# Patient Record
Sex: Female | Born: 1975
Health system: Southern US, Community
[De-identification: ages and names within clinical notes are randomized; demographics above are authoritative.]

## PROBLEM LIST (undated history)

## (undated) DIAGNOSIS — R51 Headache: Secondary | ICD-10-CM

## (undated) DIAGNOSIS — I10 Essential (primary) hypertension: Secondary | ICD-10-CM

## (undated) DIAGNOSIS — R112 Nausea with vomiting, unspecified: Secondary | ICD-10-CM

## (undated) DIAGNOSIS — C801 Malignant (primary) neoplasm, unspecified: Secondary | ICD-10-CM

## (undated) DIAGNOSIS — Z9889 Other specified postprocedural states: Secondary | ICD-10-CM

## (undated) DIAGNOSIS — R519 Headache, unspecified: Secondary | ICD-10-CM

## (undated) HISTORY — PX: LAPAROSCOPY: SHX197

---

## 1997-12-22 ENCOUNTER — Other Ambulatory Visit: Admission: RE | Admit: 1997-12-22 | Discharge: 1997-12-22 | Payer: Self-pay | Admitting: Obstetrics and Gynecology

## 1999-02-25 ENCOUNTER — Other Ambulatory Visit: Admission: RE | Admit: 1999-02-25 | Discharge: 1999-02-25 | Payer: Self-pay | Admitting: *Deleted

## 2000-03-06 ENCOUNTER — Other Ambulatory Visit: Admission: RE | Admit: 2000-03-06 | Discharge: 2000-03-06 | Payer: Self-pay | Admitting: Obstetrics and Gynecology

## 2000-09-04 ENCOUNTER — Ambulatory Visit (HOSPITAL_COMMUNITY): Admission: RE | Admit: 2000-09-04 | Discharge: 2000-09-04 | Payer: Self-pay | Admitting: Obstetrics and Gynecology

## 2000-09-04 ENCOUNTER — Encounter (INDEPENDENT_AMBULATORY_CARE_PROVIDER_SITE_OTHER): Payer: Self-pay | Admitting: Specialist

## 2001-05-23 ENCOUNTER — Other Ambulatory Visit: Admission: RE | Admit: 2001-05-23 | Discharge: 2001-05-23 | Payer: Self-pay | Admitting: Obstetrics and Gynecology

## 2001-09-30 ENCOUNTER — Emergency Department (HOSPITAL_COMMUNITY): Admission: EM | Admit: 2001-09-30 | Discharge: 2001-09-30 | Payer: Self-pay | Admitting: Emergency Medicine

## 2001-09-30 ENCOUNTER — Encounter: Payer: Self-pay | Admitting: Emergency Medicine

## 2002-09-23 ENCOUNTER — Other Ambulatory Visit: Admission: RE | Admit: 2002-09-23 | Discharge: 2002-09-23 | Payer: Self-pay | Admitting: Obstetrics and Gynecology

## 2006-10-19 ENCOUNTER — Inpatient Hospital Stay (HOSPITAL_COMMUNITY): Admission: RE | Admit: 2006-10-19 | Discharge: 2006-10-21 | Payer: Self-pay | Admitting: Obstetrics and Gynecology

## 2010-06-03 ENCOUNTER — Emergency Department (HOSPITAL_COMMUNITY): Payer: 59

## 2010-06-03 ENCOUNTER — Emergency Department (HOSPITAL_COMMUNITY)
Admission: EM | Admit: 2010-06-03 | Discharge: 2010-06-03 | Disposition: A | Discharge status: Home / Self Care | Payer: 59 | Attending: Emergency Medicine | Admitting: Emergency Medicine

## 2010-06-03 DIAGNOSIS — R079 Chest pain, unspecified: Secondary | ICD-10-CM | POA: Insufficient documentation

## 2010-06-03 DIAGNOSIS — R0602 Shortness of breath: Secondary | ICD-10-CM | POA: Insufficient documentation

## 2010-06-03 DIAGNOSIS — R05 Cough: Secondary | ICD-10-CM | POA: Insufficient documentation

## 2010-06-03 DIAGNOSIS — R791 Abnormal coagulation profile: Secondary | ICD-10-CM | POA: Insufficient documentation

## 2010-06-03 DIAGNOSIS — R059 Cough, unspecified: Secondary | ICD-10-CM | POA: Insufficient documentation

## 2010-06-03 DIAGNOSIS — I10 Essential (primary) hypertension: Secondary | ICD-10-CM | POA: Insufficient documentation

## 2010-06-03 DIAGNOSIS — J189 Pneumonia, unspecified organism: Secondary | ICD-10-CM | POA: Insufficient documentation

## 2010-06-03 LAB — BASIC METABOLIC PANEL
BUN: 13 mg/dL (ref 6–23)
CO2: 27 mEq/L (ref 19–32)
Calcium: 9 mg/dL (ref 8.4–10.5)
Creatinine, Ser: 0.5 mg/dL (ref 0.4–1.2)
GFR calc non Af Amer: >60 mL/min (ref >60)
Glucose, Bld: 97 mg/dL (ref 70–99)
Sodium: 136 mEq/L (ref 135–145)

## 2010-06-03 LAB — DIFFERENTIAL
Basophils Absolute: 0 10*3/uL (ref 0.0–0.1)
Basophils Relative: 0 % (ref 0–1)
Eosinophils Absolute: 0.1 10*3/uL (ref 0.0–0.7)
Eosinophils Relative: 1 % (ref 0–5)
Lymphocytes Relative: 22 % (ref 12–46)
Lymphs Abs: 1.8 10*3/uL (ref 0.7–4.0)
Monocytes Absolute: 1 10*3/uL (ref 0.1–1.0)
Monocytes Relative: 12 % (ref 3–12)
Neutro Abs: 5.4 10*3/uL (ref 1.7–7.7)
Neutrophils Relative %: 64 % (ref 43–77)

## 2010-06-03 LAB — URINALYSIS, ROUTINE W REFLEX MICROSCOPIC
Bilirubin Urine: NEGATIVE
Hgb urine dipstick: NEGATIVE
Ketones, ur: NEGATIVE mg/dL
Nitrite: NEGATIVE
Protein, ur: NEGATIVE mg/dL
Specific Gravity, Urine: 1.015 (ref 1.005–1.030)
Urobilinogen, UA: 0.2 mg/dL (ref 0.0–1.0)

## 2010-06-03 LAB — CBC
HCT: 40.4 % (ref 36.0–46.0)
Hemoglobin: 13.7 g/dL (ref 12.0–15.0)
MCH: 29.7 pg (ref 26.0–34.0)
MCHC: 33.9 g/dL (ref 30.0–36.0)
MCV: 87.4 fL (ref 78.0–100.0)
RBC: 4.62 MIL/uL (ref 3.87–5.11)
WBC: 8.4 10*3/uL (ref 4.0–10.5)

## 2010-06-03 LAB — POCT CARDIAC MARKERS
CKMB, poc: <1 ng/mL — ABNORMAL LOW (ref 1.0–8.0)
Myoglobin, poc: 45.2 ng/mL (ref 12–200)
Troponin i, poc: <0.05 ng/mL (ref 0.00–0.09)

## 2010-06-03 MED ORDER — IOHEXOL 300 MG/ML  SOLN
100.0000 mL | Freq: Once | INTRAMUSCULAR | Status: AC | PRN
  Administered 2010-06-03: 100 mL via INTRAVENOUS

## 2010-08-10 NOTE — H&P (Signed)
Brittany [name], Brittany [name]            ACCOUNT NO.:  [id number]   MEDICAL RECORD NO.:  [id number]          PATIENT TYPE:  INP   LOCATION:                                FACILITY:  [facility]   PHYSICIAN:  Lenoard Aden, M.D.DATE OF BIRTH:  1976-03-22   DATE OF ADMISSION:  10/19/2006  DATE OF DISCHARGE:                              HISTORY & PHYSICAL   CHIEF COMPLAINT:  Presumed macrosomia.   She is a 35 year old white female, G3, P2, at 39-3/7 weeks' gestation  for induction, estimated fetal weight greater that 8-1/2 pounds.   ALLERGIES:  1. She has allergies to ERYTHROMYCIN.  2. MACROBID.   MEDICATIONS:  Prenatal vitamins.   She is a nonsmoker, nondrinker.   She denies domestic or physical violence.   FAMILY HISTORY:  Family history of kidney stones, stroke, lupus,  diabetes, heart disease, hypertension.   PERSONAL HISTORY:  1. Laparoscopy.  2. Endometriosis.  3. UTI.   PREGNANCY COURSE:  Complicated by __________ and probable estimated  fetal weight possibly 9 pounds.   PHYSICAL EXAMINATION:  VITAL SIGNS:  Stable.  She is afebrile.  HEENT:  Normal.  LUNGS:  Clear.  HEART:  Regular rate and rhythm.  ABDOMEN:  Soft, gravid, nontender.  PELVIC:  Cervix 2 cm, thick, vertex, -1.  EXTREMITIES:  Normal.  NEUROLOGIC:  Nonfocal.   IMPRESSION:  A 39-week intrauterine pregnancy, probable large for  gestational age and favorable cervix for induction.   PLAN:  Proceed with induction.  Risks, benefits discussed.      Lenoard Aden, M.D.  Electronically Signed     RJT/MEDQ  D:  10/18/2006  T:  10/19/2006  Job:  [id number]

## 2010-08-13 NOTE — H&P (Signed)
[facility]  Patient:    Brittany [name], Brittany [name]                  MRN: [id number] Adm. Date:  08/30/00 Attending:  Erie Noe P. Pennie Rushing, M.D. Dictator:   Henreitta Leber, P.A.                         History and Physical  DATE OF BIRTH:                [birth year]  HISTORY OF PRESENT ILLNESS:   The patient is a 35 year old, married white female, para 1-0-0-1, with a several year history of pelvic pain and dysmenorrhea, which have worsened over the past year.  For the past year, the patient had experienced almost daily, dull, achy pelvic pain (which she rates a 5/10 on a 10 point scale), which has not responded to Naprosyn nor birth control pills.  One-week prior to and during her menstrual period, the patients pain increases to a 10/10 intensity (on a 10 point scale), with occasional fleeting, sharp pain.  The pain is made worse by prolonged sitting, and only minimally relieved by a heating pad.  The patient had a negative gonorrhea and Chlamydia cultures along with a normal pelvic ultrasound in September of 2001.  Over the past six months the patient has developed deep dyspareunia and presents now for further evaluation of her pelvic pain in the form of a diagnostic laparoscopy.  OBSTETRICAL HISTORY:          Gravida 1, para 1-0-0-1; patients blood type is A negative.  GYNECOLOGIC HISTORY:          Menarche 35 years old.  She has a history of irregular periods, however, they are regulated by birth control pills; she admits to severe dysmenorrhea.  See history of present illness; denies any history of ovarian cyst, sexual transmitted diseases or abnormal Pap smear; last Pap smear was normal, December 2001.  PAST SURGICAL HISTORY:        Positive for laser eye surgery in 1999.  Also removal of precancerous skin lesion.  PAST MEDICAL HISTORY:         Positive for dysplastic skin lesion, seasonal allergies, and a history of elevated blood  pressure.  FAMILY HISTORY:               Positive for heart disease, hypertension, insulin-dependent diabetes and lupus.  SOCIAL HISTORY:               The patient is married and she is a Production designer, theatre/television/film with [employer].  HABITS:                       The patient does not smoke and only uses alcohol on rare occasions.  MEDICATIONS:                  Alesse.  ALLERGIES:                    Drug sensitivities, ERYTHEMA and MACROBID, both of which cause severe GI upset.  REVIEW OF SYSTEMS:            Positive for frequent headaches, nocturia, urinary frequency and pelvic pain (see history of present illness), otherwise negative.  PHYSICAL EXAMINATION:  VITAL SIGNS:                  Blood pressure 110/70, weight is  127-1/2, height is 5 feet 3 inches tall.  HEENT:                        Ear, nose and throat examination is within normal limits.  NECK:                         Supple without masses.  There is no thyromegaly.  HEART:                        Regular rate and rhythm.  There is no murmur.  LUNGS:                        No wheezes, rubs, or rhonchi.  ABDOMEN:                      Bowel sounds are present.  It is soft.  The patient has tenderness without guarding in both lower quadrants.  There is no rebound or organomegaly.  BACK:                         Without CVA tenderness.  EXTREMITIES:                  Without clubbing, cyanosis, or edema.  PELVIC:                       EG/BUS is within normal limits.  Vagina is rugous.  Cervix is nontender without lesions.  Uterus, normal size, shape, consistency without tenderness.  Adnexa tender bilaterally, left greater than right without any palpable masses.  Rectovaginal without masses or tenderness.  IMPRESSION:                   1. Chronic pelvic pain.                               2. Severe dysmenorrhea.  DISPOSITION:                  A discussion was held with the patient regarding the medical and surgical options for  management of her pelvic pain and the patient has consented to undergo diagnostic laparoscopy with the possibility of ablation.  The patient understands the implications for her procedure and is willing to accept the risks to include, but are not limited to reaction to anesthesia, infection, bleeding, and damage to adjacent organs.  She is scheduled to undergo a diagnostic laparoscopy at [facility] on September 04, 2000, at 10:15 a.m. DD:  08/30/00 TD:  08/30/00 Job: [id number]

## 2010-08-13 NOTE — Op Note (Signed)
[facility]  Patient:    Brittany [name], Brittany [name]                   MRN: [id number] Proc. Date: 09/04/00 Adm. Date:  [id number] Attending:  Dierdre Forth Pearline                           Operative Report  PREOPERATIVE DIAGNOSIS:       Pelvic pain.  POSTOPERATIVE DIAGNOSIS:      Pelvic pain, possible endometriosis.  OPERATION:                    1. Diagnostic laparoscopy.                               2. Ablation of endometriosis.                               3. Peritoneal biopsies.  SURGEON:                      Vanessa P. Pennie Rushing, M.D.  ANESTHESIA:                   General orotracheal.  ESTIMATED BLOOD LOSS:         Less than 50 cc.  COMPLICATIONS:                None.  FINDINGS:                     The uterus, tubes, and ovaries were within normal limits without adhesions or evidence of endometriosis.  The uterus was posterior, and, in the posterior cul-de-sacs on each of the uterosacral ligaments, there were powder burn lesions with peritoneal puckering consistent with endometriosis.  There were also hypopigmented lesions along the uterosacral ligament on the right side.  The ovarian fossae were free of any evidence of endometriosis.  The appendix appeared normal.  DESCRIPTION OF PROCEDURE:     The patient was taken to the operating room after appropriate identification and placed on the operating table.  After the attainment of adequate general anesthesia, she was placed in the modified lithotomy position.  The abdomen, perineum, and vagina were prepped with multiple layers of Betadine.  A Foley catheter was inserted into the bladder and connected to straight drainage.  A Hulka tenaculum was placed on the cervix.  The abdomen was draped as a sterile field.  Subumbilical and suprapubic infiltration with 0.25 Marcaine for a total of 10 cc was undertaken.  A subumbilical incision was made and a Veress cannula placed through that incision  into the peritoneal cavity.  A pneumoperitoneum was created with 3.5 liters of CO2.  The Veress cannula was removed and a laparoscopic trocar placed through that incision into the perineal cavity. The laparoscope was placed through the trocar sleeve.  Suprapubic incisions was made to the right and left of midline, and laparoscopic probe trocars placed through those incisions into the peritoneal cavity under direct visualization.  The above-noted findings were made and documented.  The biopsy forceps was used to obtain copious biopsies from each uterosacral ligament. The remainder of the lesions were then ablated with harmonic scalpel energy. Copious irrigation was carried out, and approximately 100 cc of warm lactated Ringers left in the peritoneal cavity.  All instruments were removed, and the subumbilical and suprapubic incisions were closed with subcuticular sutures of 3-0 Vicryl.  Sterile dressing were applied.  The patient was awakened from general anesthesia and taken to the recovery room in satisfactory condition having tolerated the procedure well with sponge and instrument counts correct. DD:  09/04/00 TD:  09/04/00 Job: [id number]

## 2011-01-10 LAB — CBC
HCT: 33 — ABNORMAL LOW
Hemoglobin: 12.5
MCHC: 34.6
Platelets: 156
Platelets: 183
RDW: 13.6
RDW: 13.6
WBC: 12 — ABNORMAL HIGH

## 2011-01-10 LAB — RAPID HIV SCREEN (WH-MAU): Rapid HIV Screen: NONREACTIVE

## 2011-08-11 ENCOUNTER — Encounter (HOSPITAL_COMMUNITY): Payer: Self-pay

## 2011-08-11 ENCOUNTER — Emergency Department (HOSPITAL_COMMUNITY)
Admission: EM | Admit: 2011-08-11 | Discharge: 2011-08-11 | Disposition: A | Discharge status: Home / Self Care | Payer: 59 | Attending: Emergency Medicine | Admitting: Emergency Medicine

## 2011-08-11 DIAGNOSIS — M545 Low back pain, unspecified: Secondary | ICD-10-CM | POA: Insufficient documentation

## 2011-08-11 DIAGNOSIS — M549 Dorsalgia, unspecified: Secondary | ICD-10-CM

## 2011-08-11 DIAGNOSIS — R3915 Urgency of urination: Secondary | ICD-10-CM | POA: Insufficient documentation

## 2011-08-11 DIAGNOSIS — R509 Fever, unspecified: Secondary | ICD-10-CM | POA: Insufficient documentation

## 2011-08-11 DIAGNOSIS — R350 Frequency of micturition: Secondary | ICD-10-CM | POA: Insufficient documentation

## 2011-08-11 DIAGNOSIS — R112 Nausea with vomiting, unspecified: Secondary | ICD-10-CM | POA: Insufficient documentation

## 2011-08-11 DIAGNOSIS — R109 Unspecified abdominal pain: Secondary | ICD-10-CM | POA: Insufficient documentation

## 2011-08-11 DIAGNOSIS — R300 Dysuria: Secondary | ICD-10-CM | POA: Insufficient documentation

## 2011-08-11 DIAGNOSIS — I10 Essential (primary) hypertension: Secondary | ICD-10-CM | POA: Insufficient documentation

## 2011-08-11 HISTORY — DX: Essential (primary) hypertension: I10

## 2011-08-11 LAB — URINALYSIS, ROUTINE W REFLEX MICROSCOPIC
Bilirubin Urine: NEGATIVE
Hgb urine dipstick: NEGATIVE
Nitrite: NEGATIVE
Protein, ur: NEGATIVE mg/dL
Specific Gravity, Urine: 1.013 (ref 1.005–1.030)
Urobilinogen, UA: 0.2 mg/dL (ref 0.0–1.0)

## 2011-08-11 LAB — PREGNANCY, URINE: Preg Test, Ur: NEGATIVE

## 2011-08-11 MED ORDER — ONDANSETRON HCL 4 MG/2ML IJ SOLN: 4.0000 mg | Freq: Once | INTRAMUSCULAR | Status: DC

## 2011-08-11 MED ORDER — HYDROCODONE-ACETAMINOPHEN 5-500 MG PO TABS: 1.0000 | ORAL_TABLET | Freq: Four times a day (QID) | ORAL | Status: AC | PRN

## 2011-08-11 MED ORDER — SODIUM CHLORIDE 0.9 % IV BOLUS (SEPSIS): 1000.0000 mL | Freq: Once | INTRAVENOUS | Status: DC

## 2011-08-11 NOTE — ED Notes (Signed)
Pt. With bilateral back pain, no pain with urination but a history of frequent UTI's that present with lower back pain.  Pt. Reports nausea but no vomiting.  No dysuria.

## 2011-08-11 NOTE — ED Notes (Signed)
Patient reports that she has had low back pain with dysuria x 3 days. Patient denies hematuria and chills

## 2011-08-11 NOTE — ED Notes (Signed)
Assisted with Pelvic Exam, Dr. Rosalia Hammers told me to" disregard the specimen". RN  Aram Beecham aware

## 2011-08-11 NOTE — ED Notes (Signed)
Pt. Refused  Medications and fluid.  PA aware.

## 2011-08-11 NOTE — Discharge Instructions (Signed)
Back Pain, Adult Low back pain is very common. About 1 in 5 people have back pain.The cause of low back pain is rarely dangerous. The pain often gets better over time.About half of people with a sudden onset of back pain feel better in just 2 weeks. About 8 in 10 people feel better by 6 weeks.  CAUSES Some common causes of back pain include:  Strain of the muscles or ligaments supporting the spine.   Wear and tear (degeneration) of the spinal discs.   Arthritis.   Direct injury to the back.  DIAGNOSIS Most of the time, the direct cause of low back pain is not known.However, back pain can be treated effectively even when the exact cause of the pain is unknown.Answering your caregiver's questions about your overall health and symptoms is one of the most accurate ways to make sure the cause of your pain is not dangerous. If your caregiver needs more information, he or she may order lab work or imaging tests (X-rays or MRIs).However, even if imaging tests show changes in your back, this usually does not require surgery. HOME CARE INSTRUCTIONS For many people, back pain returns.Since low back pain is rarely dangerous, it is often a condition that people can learn to manageon their own.   Remain active. It is stressful on the back to sit or stand in one place. Do not sit, drive, or stand in one place for more than 30 minutes at a time. Take short walks on level surfaces as soon as pain allows.Try to increase the length of time you walk each day.   Do not stay in bed.Resting more than 1 or 2 days can delay your recovery.   Do not avoid exercise or work.Your body is made to move.It is not dangerous to be active, even though your back may hurt.Your back will likely heal faster if you return to being active before your pain is gone.   Pay attention to your body when you bend and lift. Many people have less discomfortwhen lifting if they bend their knees, keep the load close to their  bodies,and avoid twisting. Often, the most comfortable positions are those that put less stress on your recovering back.   Find a comfortable position to sleep. Use a firm mattress and lie on your side with your knees slightly bent. If you lie on your back, put a pillow under your knees.   Only take over-the-counter or prescription medicines as directed by your caregiver. Over-the-counter medicines to reduce pain and inflammation are often the most helpful.Your caregiver may prescribe muscle relaxant drugs.These medicines help dull your pain so you can more quickly return to your normal activities and healthy exercise.   Put ice on the injured area.   Put ice in a plastic bag.   Place a towel between your skin and the bag.   Leave the ice on for 15 to 20 minutes, 3 to 4 times a day for the first 2 to 3 days. After that, ice and heat may be alternated to reduce pain and spasms.   Ask your caregiver about trying back exercises and gentle massage. This may be of some benefit.   Avoid feeling anxious or stressed.Stress increases muscle tension and can worsen back pain.It is important to recognize when you are anxious or stressed and learn ways to manage it.Exercise is a great option.  SEEK MEDICAL CARE IF:  You have pain that is not relieved with rest or medicine.   You have   pain that does not improve in 1 week.   You have new symptoms.   You are generally not feeling well.  SEEK IMMEDIATE MEDICAL CARE IF:   You have pain that radiates from your back into your legs.   You develop new bowel or bladder control problems.   You have unusual weakness or numbness in your arms or legs.   You develop nausea or vomiting.   You develop abdominal pain.   You feel faint.  Document Released: 03/14/2005 Document Revised: 03/03/2011 Document Reviewed: 08/02/2010 [employer] Patient Information 2012 [employer]. 

## 2011-08-11 NOTE — ED Provider Notes (Signed)
History     CSN: [id number]  Arrival date & time 08/11/11  1450   None     Chief Complaint  Patient presents with  . Back Pain  . Dysuria    (Consider location/radiation/quality/duration/timing/severity/associated sxs/prior treatment) Patient is a 36 y.o. female presenting with back pain and dysuria. The history is provided by the patient.  Back Pain  The current episode started 3 to 5 hours ago. The problem occurs constantly. The pain is associated with no known injury. The pain is present in the lumbar spine. The quality of the pain is described as aching. The pain is at a severity of 5/10. Associated symptoms include dysuria. Pertinent negatives include no bowel incontinence, no perianal numbness, no bladder incontinence, no paresthesias, no paresis, no tingling and no weakness. She has tried NSAIDs for the symptoms. The treatment provided no relief.  Dysuria  The current episode started 3 to 5 hours ago. The problem occurs every urination. The problem has not changed since onset.The quality of the pain is described as burning. The pain is at a severity of 4/10. The maximum temperature recorded prior to her arrival was 100 to 100.9 F. The fever has been present for 3 to 4 days. She is sexually active. There is no history of pyelonephritis. Associated symptoms include chills, nausea, vomiting, frequency, urgency and flank pain. Pertinent negatives include no sweats, no discharge, no hematuria, no hesitancy and no possible pregnancy. She has tried NSAIDs for the symptoms. Her past medical history is significant for recurrent UTIs. Her past medical history does not include single kidney.    Past Medical History  Diagnosis Date  . Hypertension     Past Surgical History  Procedure Date  . Laparoscopy     History reviewed. No pertinent family history.  History  Substance Use Topics  . Smoking status: Never Smoker   . Smokeless tobacco: Never Used  . Alcohol Use: 0.6 oz/week    1  Glasses of wine per week     occasionally    OB History    Grav Para Term Preterm Abortions TAB SAB Ect Mult Living                  Review of Systems  Constitutional: Positive for chills.  Gastrointestinal: Positive for nausea and vomiting. Negative for bowel incontinence.  Genitourinary: Positive for dysuria, urgency, frequency and flank pain. Negative for bladder incontinence, hesitancy and hematuria.  Musculoskeletal: Positive for back pain.  Neurological: Negative for tingling, weakness and paresthesias.  All other systems reviewed and are negative.    Allergies  Erythromycin and Macrobid  Home Medications   Current Outpatient Rx  Name Route Sig Dispense Refill  . IBUPROFEN 200 MG PO TABS Oral Take 800 mg by mouth every 6 (six) hours as needed. pain    . LISINOPRIL 10 MG PO TABS Oral Take 10 mg by mouth daily.    Marland Kitchen GENERESS FE PO Oral Take 1 tablet by mouth daily.      BP 137/77  Pulse 85  Temp(Src) 98.2 F (36.8 C) (Oral)  Resp 16  SpO2 100%  LMP 07/28/2011  Physical Exam  Nursing note and vitals reviewed. Constitutional: She appears well-developed and well-nourished.  HENT:  Head: Normocephalic and atraumatic.  Eyes: Conjunctivae and EOM are normal. Pupils are equal, round, and reactive to light.  Neck: Normal range of motion. Neck supple.  Cardiovascular: Normal rate, regular rhythm, normal heart sounds and intact distal pulses.   Pulmonary/Chest: Effort  normal and breath sounds normal.  Abdominal: Soft. Bowel sounds are normal.  Genitourinary: Vagina normal and uterus normal. No vaginal discharge found.  Musculoskeletal: Normal range of motion.  Neurological: She is alert.  Skin: Skin is warm and dry.  Psychiatric: She has a normal mood and affect. Her behavior is normal. Thought content normal.    ED Course  Procedures (including critical care time)   Labs Reviewed  PREGNANCY, URINE  URINALYSIS, ROUTINE W REFLEX MICROSCOPIC   No results  found.   No diagnosis found.    MDM  Patient now states that her abdomen only hurt because she had to urinate at the time of exam. Reexamination reveals abdomen to be soft and nontender. Patient states she has been doing lifting recently and has some pain in her right low back and buttock area. That has not been relieved with nonsteroidals or Tylenol. She requested other pain medicine. Patient advised to orally rehydrate and recheck if she is unable to keep down fluids as she had some ketones in her urine. She does not appear to have urinary tract infection she does appear to have musculoskeletal back pain but is advised to return if it is not resolved.       Hilario Quarry, MD 08/11/11 [phone]

## 2012-12-19 ENCOUNTER — Encounter (HOSPITAL_COMMUNITY): Payer: Self-pay | Admitting: Pharmacist

## 2012-12-24 ENCOUNTER — Encounter (HOSPITAL_COMMUNITY): Payer: Self-pay

## 2012-12-24 ENCOUNTER — Encounter (HOSPITAL_COMMUNITY)
Admission: RE | Admit: 2012-12-24 | Discharge: 2012-12-24 | Disposition: A | Discharge status: Home / Self Care | Payer: 59 | Source: Ambulatory Visit | Attending: Obstetrics and Gynecology | Admitting: Obstetrics and Gynecology

## 2012-12-24 DIAGNOSIS — Z01812 Encounter for preprocedural laboratory examination: Secondary | ICD-10-CM | POA: Insufficient documentation

## 2012-12-24 DIAGNOSIS — Z01818 Encounter for other preprocedural examination: Secondary | ICD-10-CM | POA: Insufficient documentation

## 2012-12-24 HISTORY — DX: Nausea with vomiting, unspecified: R11.2

## 2012-12-24 HISTORY — DX: Nausea with vomiting, unspecified: Z98.890

## 2012-12-24 LAB — BASIC METABOLIC PANEL
Calcium: 9.1 mg/dL (ref 8.4–10.5)
Creatinine, Ser: 0.71 mg/dL (ref 0.50–1.10)
GFR calc Af Amer: >90 mL/min (ref >90)
Sodium: 138 mEq/L (ref 135–145)

## 2012-12-24 LAB — CBC
MCH: 31 pg (ref 26.0–34.0)
MCV: 90.9 fL (ref 78.0–100.0)
Platelets: 241 10*3/uL (ref 150–400)
RDW: 12.7 % (ref 11.5–15.5)
WBC: 6.1 10*3/uL (ref 4.0–10.5)

## 2012-12-24 NOTE — Patient Instructions (Addendum)
Your procedure is scheduled on:01/03/13  Enter through the Main Entrance at :1145am Pick up desk phone and dial [phone] and inform us of your arrival.  Please call [phone] if you have any problems the morning of surgery.  Remember: Do not eat food after midnight:Wed. Clear liquids are ok until:9am Thursday   You may brush your teeth the morning of surgery.  Take these meds the morning of surgery with a sip of water:Labetalol  DO NOT wear jewelry, eye make-up, lipstick,body lotion, or dark fingernail polish.  (Polished toes are ok) You may wear deodorant.   Patients discharged on the day of surgery will not be allowed to drive home. Wear loose fitting, comfortable clothes for your ride home.

## 2012-12-25 ENCOUNTER — Other Ambulatory Visit: Payer: Self-pay | Admitting: Obstetrics and Gynecology

## 2012-12-28 MED ORDER — BUPIVACAINE HCL (PF) 0.5 % IJ SOLN
INTRAMUSCULAR | Status: AC
  Filled 2012-12-28: qty 30

## 2013-01-03 ENCOUNTER — Encounter (HOSPITAL_COMMUNITY)
Admission: RE | Disposition: A | Discharge status: Home / Self Care | Payer: Self-pay | Source: Ambulatory Visit | Attending: Obstetrics and Gynecology

## 2013-01-03 ENCOUNTER — Ambulatory Visit (HOSPITAL_COMMUNITY): Payer: 59 | Admitting: Anesthesiology

## 2013-01-03 ENCOUNTER — Encounter (HOSPITAL_COMMUNITY): Payer: 59 | Admitting: Anesthesiology

## 2013-01-03 ENCOUNTER — Ambulatory Visit (HOSPITAL_COMMUNITY)
Admission: RE | Admit: 2013-01-03 | Discharge: 2013-01-03 | Disposition: A | Discharge status: Home / Self Care | Payer: 59 | Source: Ambulatory Visit | Attending: Obstetrics and Gynecology | Admitting: Obstetrics and Gynecology

## 2013-01-03 ENCOUNTER — Encounter (HOSPITAL_COMMUNITY): Payer: Self-pay | Admitting: *Deleted

## 2013-01-03 DIAGNOSIS — N803 Endometriosis of pelvic peritoneum, unspecified: Secondary | ICD-10-CM | POA: Insufficient documentation

## 2013-01-03 DIAGNOSIS — I10 Essential (primary) hypertension: Secondary | ICD-10-CM | POA: Insufficient documentation

## 2013-01-03 DIAGNOSIS — IMO0002 Reserved for concepts with insufficient information to code with codable children: Secondary | ICD-10-CM | POA: Insufficient documentation

## 2013-01-03 DIAGNOSIS — N946 Dysmenorrhea, unspecified: Secondary | ICD-10-CM | POA: Insufficient documentation

## 2013-01-03 DIAGNOSIS — N83209 Unspecified ovarian cyst, unspecified side: Secondary | ICD-10-CM | POA: Insufficient documentation

## 2013-01-03 DIAGNOSIS — N949 Unspecified condition associated with female genital organs and menstrual cycle: Secondary | ICD-10-CM | POA: Insufficient documentation

## 2013-01-03 DIAGNOSIS — N809 Endometriosis, unspecified: Secondary | ICD-10-CM

## 2013-01-03 HISTORY — PX: ROBOTIC ASSISTED LAPAROSCOPIC LYSIS OF ADHESION: SHX6080

## 2013-01-03 LAB — HCG, SERUM, QUALITATIVE: Preg, Serum: NEGATIVE

## 2013-01-03 SURGERY — ROBOTIC ASSISTED LAPAROSCOPIC LYSIS OF ADHESION
Anesthesia: General

## 2013-01-03 MED ORDER — FENTANYL CITRATE 0.05 MG/ML IJ SOLN
INTRAMUSCULAR | Status: AC
  Administered 2013-01-03: 50 ug via INTRAVENOUS
  Filled 2013-01-03: qty 2

## 2013-01-03 MED ORDER — CEFAZOLIN SODIUM-DEXTROSE 2-3 GM-% IV SOLR
INTRAVENOUS | Status: AC
  Filled 2013-01-03: qty 50

## 2013-01-03 MED ORDER — SODIUM CHLORIDE 0.9 % IJ SOLN
INTRAMUSCULAR | Status: AC
  Filled 2013-01-03: qty 100

## 2013-01-03 MED ORDER — ROCURONIUM BROMIDE 100 MG/10ML IV SOLN
INTRAVENOUS | Status: DC | PRN
  Administered 2013-01-03: 5 mg via INTRAVENOUS
  Administered 2013-01-03: 10 mg via INTRAVENOUS
  Administered 2013-01-03: 25 mg via INTRAVENOUS

## 2013-01-03 MED ORDER — ARTIFICIAL TEARS OP OINT
TOPICAL_OINTMENT | OPHTHALMIC | Status: AC
  Filled 2013-01-03: qty 3.5

## 2013-01-03 MED ORDER — ONDANSETRON HCL 4 MG/2ML IJ SOLN
INTRAMUSCULAR | Status: AC
  Filled 2013-01-03: qty 2

## 2013-01-03 MED ORDER — BUPIVACAINE HCL (PF) 0.25 % IJ SOLN
INTRAMUSCULAR | Status: AC
  Filled 2013-01-03: qty 30

## 2013-01-03 MED ORDER — GLYCOPYRROLATE 0.2 MG/ML IJ SOLN
INTRAMUSCULAR | Status: DC | PRN
  Administered 2013-01-03: 0.4 mg via INTRAVENOUS

## 2013-01-03 MED ORDER — INDIGOTINDISULFONATE SODIUM 8 MG/ML IJ SOLN
INTRAMUSCULAR | Status: AC
  Filled 2013-01-03: qty 5

## 2013-01-03 MED ORDER — LACTATED RINGERS IV SOLN
INTRAVENOUS | Status: DC
  Administered 2013-01-03 (×3): via INTRAVENOUS

## 2013-01-03 MED ORDER — BUPIVACAINE HCL (PF) 0.25 % IJ SOLN
INTRAMUSCULAR | Status: DC | PRN
  Administered 2013-01-03: 10 mL

## 2013-01-03 MED ORDER — LACTATED RINGERS IR SOLN
Status: DC | PRN
  Administered 2013-01-03: 3000 mL

## 2013-01-03 MED ORDER — OXYCODONE-ACETAMINOPHEN 5-325 MG PO TABS
ORAL_TABLET | ORAL | Status: AC
  Filled 2013-01-03: qty 1

## 2013-01-03 MED ORDER — ONDANSETRON HCL 4 MG/2ML IJ SOLN
INTRAMUSCULAR | Status: DC | PRN
  Administered 2013-01-03: 4 mg via INTRAMUSCULAR

## 2013-01-03 MED ORDER — KETOROLAC TROMETHAMINE 30 MG/ML IJ SOLN
INTRAMUSCULAR | Status: DC | PRN
  Administered 2013-01-03: 30 mg via INTRAVENOUS

## 2013-01-03 MED ORDER — CEFAZOLIN SODIUM-DEXTROSE 2-3 GM-% IV SOLR
2.0000 g | INTRAVENOUS | Status: AC
  Administered 2013-01-03: 2 g via INTRAVENOUS

## 2013-01-03 MED ORDER — DEXAMETHASONE SODIUM PHOSPHATE 10 MG/ML IJ SOLN
INTRAMUSCULAR | Status: DC | PRN
  Administered 2013-01-03: 10 mg via INTRAVENOUS

## 2013-01-03 MED ORDER — OXYCODONE-ACETAMINOPHEN 5-325 MG PO TABS
1.0000 | ORAL_TABLET | ORAL | Status: DC | PRN
  Administered 2013-01-03: 1 via ORAL

## 2013-01-03 MED ORDER — OXYCODONE-ACETAMINOPHEN 5-325 MG PO TABS: 1.0000 | ORAL_TABLET | ORAL | Status: DC | PRN

## 2013-01-03 MED ORDER — LIDOCAINE HCL (CARDIAC) 20 MG/ML IV SOLN
INTRAVENOUS | Status: DC | PRN
  Administered 2013-01-03: 50 mg via INTRAVENOUS

## 2013-01-03 MED ORDER — FENTANYL CITRATE 0.05 MG/ML IJ SOLN
INTRAMUSCULAR | Status: DC | PRN
  Administered 2013-01-03 (×2): 50 ug via INTRAVENOUS
  Administered 2013-01-03: 100 ug via INTRAVENOUS
  Administered 2013-01-03: 50 ug via INTRAVENOUS

## 2013-01-03 MED ORDER — PROPOFOL 10 MG/ML IV BOLUS
INTRAVENOUS | Status: DC | PRN
  Administered 2013-01-03: 180 mg via INTRAVENOUS

## 2013-01-03 MED ORDER — DIPHENHYDRAMINE HCL 50 MG/ML IJ SOLN
INTRAMUSCULAR | Status: AC
  Administered 2013-01-03: 25 mg via INTRAVENOUS
  Filled 2013-01-03: qty 1

## 2013-01-03 MED ORDER — MIDAZOLAM HCL 2 MG/2ML IJ SOLN
INTRAMUSCULAR | Status: AC
  Filled 2013-01-03: qty 2

## 2013-01-03 MED ORDER — MIDAZOLAM HCL 2 MG/2ML IJ SOLN
INTRAMUSCULAR | Status: DC | PRN
  Administered 2013-01-03: 2 mg via INTRAVENOUS

## 2013-01-03 MED ORDER — DEXAMETHASONE SODIUM PHOSPHATE 10 MG/ML IJ SOLN
INTRAMUSCULAR | Status: AC
  Filled 2013-01-03: qty 1

## 2013-01-03 MED ORDER — LIDOCAINE HCL (CARDIAC) 20 MG/ML IV SOLN
INTRAVENOUS | Status: AC
  Filled 2013-01-03: qty 5

## 2013-01-03 MED ORDER — METOCLOPRAMIDE HCL 5 MG/ML IJ SOLN
INTRAMUSCULAR | Status: AC
  Administered 2013-01-03: 10 mg via INTRAVENOUS
  Filled 2013-01-03: qty 2

## 2013-01-03 MED ORDER — FENTANYL CITRATE 0.05 MG/ML IJ SOLN
INTRAMUSCULAR | Status: AC
  Filled 2013-01-03: qty 5

## 2013-01-03 MED ORDER — DIPHENHYDRAMINE HCL 50 MG/ML IJ SOLN
25.0000 mg | Freq: Once | INTRAMUSCULAR | Status: AC
  Administered 2013-01-03: 25 mg via INTRAVENOUS

## 2013-01-03 MED ORDER — MEPERIDINE HCL 25 MG/ML IJ SOLN: 6.2500 mg | INTRAMUSCULAR | Status: DC | PRN

## 2013-01-03 MED ORDER — SCOPOLAMINE 1 MG/3DAYS TD PT72
MEDICATED_PATCH | TRANSDERMAL | Status: AC
  Administered 2013-01-03: 1.5 mg via TRANSDERMAL
  Filled 2013-01-03: qty 1

## 2013-01-03 MED ORDER — PROPOFOL 10 MG/ML IV EMUL
INTRAVENOUS | Status: AC
  Filled 2013-01-03: qty 20

## 2013-01-03 MED ORDER — HEPARIN SODIUM (PORCINE) 5000 UNIT/ML IJ SOLN
INTRAMUSCULAR | Status: AC
  Filled 2013-01-03: qty 1

## 2013-01-03 MED ORDER — NEOSTIGMINE METHYLSULFATE 1 MG/ML IJ SOLN
INTRAMUSCULAR | Status: DC | PRN
  Administered 2013-01-03: 2 mg via INTRAVENOUS

## 2013-01-03 MED ORDER — ROPIVACAINE HCL 5 MG/ML IJ SOLN
INTRAMUSCULAR | Status: AC
  Filled 2013-01-03: qty 30

## 2013-01-03 MED ORDER — SCOPOLAMINE 1 MG/3DAYS TD PT72
1.0000 | MEDICATED_PATCH | TRANSDERMAL | Status: DC
  Administered 2013-01-03: 1.5 mg via TRANSDERMAL

## 2013-01-03 MED ORDER — ROCURONIUM BROMIDE 50 MG/5ML IV SOLN
INTRAVENOUS | Status: AC
  Filled 2013-01-03: qty 1

## 2013-01-03 MED ORDER — FENTANYL CITRATE 0.05 MG/ML IJ SOLN
25.0000 ug | INTRAMUSCULAR | Status: DC | PRN
  Administered 2013-01-03: 25 ug via INTRAVENOUS
  Administered 2013-01-03: 50 ug via INTRAVENOUS
  Administered 2013-01-03: 25 ug via INTRAVENOUS

## 2013-01-03 MED ORDER — METOCLOPRAMIDE HCL 5 MG/ML IJ SOLN
10.0000 mg | Freq: Once | INTRAMUSCULAR | Status: AC | PRN
  Administered 2013-01-03: 10 mg via INTRAVENOUS

## 2013-01-03 SURGICAL SUPPLY — 79 items
ADH SKN CLS APL DERMABOND .7 (GAUZE/BANDAGES/DRESSINGS) ×1
BAG URINE DRAINAGE (UROLOGICAL SUPPLIES) ×2 IMPLANT
BARRIER ADHS 3X4 INTERCEED (GAUZE/BANDAGES/DRESSINGS) IMPLANT
BRR ADH 4X3 ABS CNTRL BYND (GAUZE/BANDAGES/DRESSINGS)
CABLE HIGH FREQUENCY MONO STRZ (ELECTRODE) ×1 IMPLANT
CATH FOLEY 3WAY  5CC 16FR (CATHETERS) ×1
CATH FOLEY 3WAY 5CC 16FR (CATHETERS) ×1 IMPLANT
CATH ROBINSON RED A/P 16FR (CATHETERS) IMPLANT
CHLORAPREP W/TINT 26ML (MISCELLANEOUS) ×2 IMPLANT
CLOTH BEACON ORANGE TIMEOUT ST (SAFETY) ×2 IMPLANT
CONT PATH 16OZ SNAP LID 3702 (MISCELLANEOUS) ×2 IMPLANT
COVER MAYO STAND STRL (DRAPES) ×2 IMPLANT
COVER TABLE BACK 60X90 (DRAPES) ×4 IMPLANT
COVER TIP SHEARS 8 DVNC (MISCELLANEOUS) ×1 IMPLANT
COVER TIP SHEARS 8MM DA VINCI (MISCELLANEOUS) ×1
DECANTER SPIKE VIAL GLASS SM (MISCELLANEOUS) ×2 IMPLANT
DERMABOND ADVANCED (GAUZE/BANDAGES/DRESSINGS) ×1
DERMABOND ADVANCED .7 DNX12 (GAUZE/BANDAGES/DRESSINGS) ×1 IMPLANT
DRAPE HUG U DISPOSABLE (DRAPE) ×2 IMPLANT
DRAPE LG THREE QUARTER DISP (DRAPES) ×4 IMPLANT
DRAPE WARM FLUID 44X44 (DRAPE) ×2 IMPLANT
ELECT REM PT RETURN 9FT ADLT (ELECTROSURGICAL) ×2
ELECTRODE REM PT RTRN 9FT ADLT (ELECTROSURGICAL) ×1 IMPLANT
EVACUATOR SMOKE 8.L (FILTER) ×2 IMPLANT
FORCEPS CUTTING 33CM 5MM (CUTTING FORCEPS) IMPLANT
FORCEPS CUTTING 45CM 5MM (CUTTING FORCEPS) IMPLANT
GAS CARTRIDGE (MEDICAL GASES) IMPLANT
GAUZE VASELINE 3X9 (GAUZE/BANDAGES/DRESSINGS) IMPLANT
GLOVE BIO SURGEON STRL SZ7.5 (GLOVE) ×4 IMPLANT
GOWN PREVENTION PLUS LG XLONG (DISPOSABLE) ×4 IMPLANT
GOWN PREVENTION PLUS XLARGE (GOWN DISPOSABLE) ×2 IMPLANT
GOWN STRL REIN XL XLG (GOWN DISPOSABLE) ×12 IMPLANT
GYRUS RUMI II 2.5CM BLUE (DISPOSABLE)
GYRUS RUMI II 3.5CM BLUE (DISPOSABLE)
GYRUS RUMI II 4.0CM BLUE (DISPOSABLE)
KIT ACCESSORY DA VINCI DISP (KITS) ×1
KIT ACCESSORY DVNC DISP (KITS) ×1 IMPLANT
LEGGING LITHOTOMY PAIR STRL (DRAPES) ×2 IMPLANT
NEEDLE INSUFFLATION 120MM (ENDOMECHANICALS) ×2 IMPLANT
PACK LAPAROSCOPY BASIN (CUSTOM PROCEDURE TRAY) ×2 IMPLANT
PACK LAVH (CUSTOM PROCEDURE TRAY) ×2 IMPLANT
PAD PREP 24X48 CUFFED NSTRL (MISCELLANEOUS) ×4 IMPLANT
PLUG CATH AND CAP STER (CATHETERS) ×2 IMPLANT
PROTECTOR NERVE ULNAR (MISCELLANEOUS) ×4 IMPLANT
RUMI II 3.0CM BLUE KOH-EFFICIE (DISPOSABLE) IMPLANT
RUMI II GYRUS 2.5CM BLUE (DISPOSABLE) IMPLANT
RUMI II GYRUS 3.5CM BLUE (DISPOSABLE) IMPLANT
RUMI II GYRUS 4.0CM BLUE (DISPOSABLE) IMPLANT
SET CYSTO W/LG BORE CLAMP LF (SET/KITS/TRAYS/PACK) ×1 IMPLANT
SET IRRIG TUBING LAPAROSCOPIC (IRRIGATION / IRRIGATOR) ×2 IMPLANT
SOLUTION ELECTROLUBE (MISCELLANEOUS) ×2 IMPLANT
SUT VIC AB 0 CT1 27 (SUTURE) ×4
SUT VIC AB 0 CT1 27XBRD ANBCTR (SUTURE) ×2 IMPLANT
SUT VIC AB 0 CT1 27XBRD ANTBC (SUTURE) IMPLANT
SUT VICRYL 0 UR6 27IN ABS (SUTURE) ×2 IMPLANT
SUT VICRYL 4-0 PS2 18IN ABS (SUTURE) ×4 IMPLANT
SUT VICRYL RAPIDE 4/0 PS 2 (SUTURE) ×4 IMPLANT
SYR 50ML LL SCALE MARK (SYRINGE) ×2 IMPLANT
SYRINGE 10CC LL (SYRINGE) ×2 IMPLANT
SYSTEM CONVERTIBLE TROCAR (TROCAR) IMPLANT
TAPE UMBILICAL 1/8 X36 TWILL (MISCELLANEOUS) IMPLANT
TIP UTERINE 5.1X6CM LAV DISP (MISCELLANEOUS) IMPLANT
TIP UTERINE 6.7X10CM GRN DISP (MISCELLANEOUS) ×1 IMPLANT
TIP UTERINE 6.7X6CM WHT DISP (MISCELLANEOUS) IMPLANT
TIP UTERINE 6.7X8CM BLUE DISP (MISCELLANEOUS) IMPLANT
TOWEL OR 17X24 6PK STRL BLUE (TOWEL DISPOSABLE) ×6 IMPLANT
TRAY FOLEY CATH 14FR (SET/KITS/TRAYS/PACK) ×2 IMPLANT
TROCAR BLADELESS OPT 12M 100M (ENDOMECHANICALS) IMPLANT
TROCAR DISP BLADELESS 8 DVNC (TROCAR) ×1 IMPLANT
TROCAR DISP BLADELESS 8MM (TROCAR) ×1
TROCAR OPTI TIP 5M 100M (ENDOMECHANICALS) ×2 IMPLANT
TROCAR XCEL 12X100 BLDLESS (ENDOMECHANICALS) IMPLANT
TROCAR XCEL DIL TIP R 11M (ENDOMECHANICALS) ×2 IMPLANT
TROCAR XCEL NON-BLD 5MMX100MML (ENDOMECHANICALS) ×2 IMPLANT
TROCAR XCEL OPT SLVE 5M 100M (ENDOMECHANICALS) IMPLANT
TROCAR Z-THREAD 12X150 (TROCAR) ×2 IMPLANT
TUBING FILTER THERMOFLATOR (ELECTROSURGICAL) ×2 IMPLANT
WARMER LAPAROSCOPE (MISCELLANEOUS) ×2 IMPLANT
WATER STERILE IRR 1000ML POUR (IV SOLUTION) ×6 IMPLANT

## 2013-01-03 NOTE — Transfer of Care (Signed)
Immediate Anesthesia Transfer of Care Note  Patient: Brittany [name]  Procedure(s) Performed: Procedure(s): ROBOTIC ASSISTED LAPAROSCOPIC LYSIS OF ADHESION; Ablation of Endometriosis (N/A)  Patient Location: PACU  Anesthesia Type:General  Level of Consciousness: awake  Airway & Oxygen Therapy: Patient Spontanous Breathing  Post-op Assessment: Report given to PACU RN  Post vital signs: stable  Filed Vitals:   01/03/13 1318  BP: 128/74  Pulse:   Temp:   Resp:     Complications: No apparent anesthesia complications

## 2013-01-03 NOTE — Anesthesia Preprocedure Evaluation (Addendum)
Anesthesia Evaluation  Patient identified by MRN, date of birth, ID band Patient awake    Reviewed: Allergy & Precautions  History of Anesthesia Complications (+) PONV and history of anesthetic complications  Airway Mallampati: I TM Distance: >3 FB Neck ROM: Full    Dental no notable dental hx. (+) Teeth Intact   Pulmonary neg pulmonary ROS,  breath sounds clear to auscultation  Pulmonary exam normal       Cardiovascular hypertension, Pt. on medications and Pt. on home beta blockers Rhythm:Regular Rate:Normal     Neuro/Psych negative neurological ROS  negative psych ROS   GI/Hepatic negative GI ROS, Neg liver ROS,   Endo/Other  negative endocrine ROS  Renal/GU negative Renal ROS  negative genitourinary   Musculoskeletal negative musculoskeletal ROS (+)   Abdominal Normal abdominal exam  (+)   Peds  Hematology negative hematology ROS (+)   Anesthesia Other Findings   Reproductive/Obstetrics Pelvic Pain  Ovarian Cyst Hx/o Endometriosis                           Anesthesia Physical Anesthesia Plan  ASA: II  Anesthesia Plan: General   Post-op Pain Management:    Induction: Intravenous  Airway Management Planned: Oral ETT  Additional Equipment:   Intra-op Plan:   Post-operative Plan: Extubation in OR  Informed Consent: I have reviewed the patients History and Physical, chart, labs and discussed the procedure including the risks, benefits and alternatives for the proposed anesthesia with the patient or authorized representative who has indicated his/her understanding and acceptance.   Dental advisory given  Plan Discussed with: CRNA, Anesthesiologist and Surgeon  Anesthesia Plan Comments:        Anesthesia Quick Evaluation

## 2013-01-03 NOTE — Progress Notes (Signed)
Patient ID: Brittany [name], female   DOB: Jul 15, 1975, 37 y.o.   MRN: [id number] Patient seen and examined. Consent witnessed and signed. No changes noted. Update completed.

## 2013-01-03 NOTE — Anesthesia Postprocedure Evaluation (Signed)
  Anesthesia Post-op Note  Patient: Brittany [name]  Procedure(s) Performed: Procedure(s): ROBOTIC ASSISTED LAPAROSCOPIC LYSIS OF ADHESION; Ablation of Endometriosis (N/A) Patient is awake and responsive. Pain and nausea are reasonably well controlled. Vital signs are stable and clinically acceptable. Oxygen saturation is clinically acceptable. There are no apparent anesthetic complications at this time. Patient is ready for discharge.

## 2013-01-03 NOTE — Addendum Note (Signed)
Addendum created 01/03/13 1752 by Algis Greenhouse, CRNA   Modules edited: Anesthesia Medication Administration

## 2013-01-03 NOTE — Op Note (Signed)
01/03/2013  3:51 PM  PATIENT:  Brittany [name]  37 y.o. female  PRE-OPERATIVE DIAGNOSIS:  Pelvic Pain, Ovarian Cyst    POST-OPERATIVE DIAGNOSIS:  Pelvic pain, ovarian cyst, pelvic endometriosis PROCEDURE:  Procedure(s): LAPAROSCOPY DIAGNOSTIC ROBOTIC ASSISTED LAPAROSCOPIC LYSIS OF ADHESION; Ablation of Endometriosis Excision of endometriosis Right ovarian cystectomy  SURGEON:  Surgeon(s): Lenoard Aden, MD  ASSISTANTS: Fredric Mare, CNM   ANESTHESIA:   local and general  ESTIMATED BLOOD LOSS: * No blood loss amount entered *   DRAINS: Urinary Catheter (Foley)   LOCAL MEDICATIONS USED:  MARCAINE     SPECIMEN:  Source of Specimen:  cyst wall, peritoneal implants  DISPOSITION OF SPECIMEN:  PATHOLOGY  COUNTS:  YES  DICTATION #: [id number]  PLAN OF CARE: DC home  PATIENT DISPOSITION:  PACU - hemodynamically stable.

## 2013-01-03 NOTE — H&P (Signed)
NAMEDRESDEN, AMENT NO.:  [id number]  MEDICAL RECORD NO.:  [id number]  LOCATION:  [facility]                         FACILITY:  [facility]  PHYSICIAN:  Lenoard Aden, M.D.DATE OF BIRTH:  1975-08-08  DATE OF ADMISSION:  11/27/2012 DATE OF DISCHARGE:                             HISTORY & PHYSICAL   CHIEF COMPLAINT:  Dysmenorrhea, pelvic pain with a history of endometriosis.  HISTORY OF PRESENT ILLNESS:  A 37 year old white female G3, P3, with a history of pelvic endometriosis and a remote history of laparoscopic ablation of endometriosis in 1999 for repeat surgical evaluation due to worsening dysmenorrhea and pelvic pain with dyspareunia.  MEDICATIONS:  Include prenatal vitamins and labetalol.  She has a personal history of endometriosis and hypertension.  ALLERGIES:  MACROBID AND ERYTHROMYCIN.  FAMILY HISTORY:  She has a family history of heart disease, diabetes, hypertension, stroke, lupus, and kidney stones.  SOCIAL HISTORY:  Noncontributory.  She has a history of three previous vaginal deliveries.  SURGICAL HISTORY:  Laparoscopy in 1999 as noted.  PHYSICAL EXAMINATION:  GENERAL:  She is a well-developed, well-nourished white female, in no acute distress. HEENT:  Normal. NECK:  Supple.  Full range of motion. LUNGS:  Clear. HEART:  Regular rate and rhythm. ABDOMEN:  Soft, nontender. PELVIC:  Reveals a normal sized, but tender and retroverted uterus. Bilateral adnexal tenderness.  No masses noted. EXTREMITIES:  No cords. NEUROLOGIC:  Nonfocal. SKIN:  Intact.  CA-125 is 24.  IMPRESSION:  Recurrent dysmenorrhea, dyspareunia with a history of endometriosis.  PLAN:  Proceed with diagnostic laparoscopy, possible daVinci assisted LS with ablation, excision of endometriosis.  Risks of anesthesia, infection, bleeding, injury to surrounding organs, possible need for repair was discussed, delayed versus immediate complications to include bowel and bladder  injury noted, inability to cure pelvic pain with surgical intervention was discussed.  The patient acknowledges and wishes to proceed.  The patient declines medical(Lupron, progestin) intervention at this time due to her desire to conceive in the near future.     Lenoard Aden, M.D.     RJT/MEDQ  D:  01/02/2013  T:  01/02/2013  Job:  [id number]

## 2013-01-04 ENCOUNTER — Encounter (HOSPITAL_COMMUNITY): Payer: Self-pay | Admitting: Obstetrics and Gynecology

## 2013-01-04 NOTE — Op Note (Signed)
NAMEYTZEL, GUBLER                 ACCOUNT NO.:  [id number]  MEDICAL RECORD NO.:  [id number]  LOCATION:  [facility]                          FACILITY:  [facility]  PHYSICIAN:  Lenoard Aden, M.D.DATE OF BIRTH:  1975/07/12  DATE OF PROCEDURE: DATE OF DISCHARGE:  01/03/2013                              OPERATIVE REPORT   DESCRIPTION OF PROCEDURE:  After being apprised of risks of anesthesia, infection, bleeding, injury to surrounding organs, possible need for repair, delayed versus immediate complications to include bowel and bladder injury, possible need for repair, in addition, inability to cure pelvic pain was discussed.  The patient was brought to the operating room where she was administered general anesthetic without complications.  She was prepped and draped in usual sterile fashion. Foley catheter placed.  A RUMI retractor placed vaginally and retroflexed uterus noted on exam under anesthesia.  Infraumbilical incision was made with a scalpel.  Veress needle placed opening pressure of -2, 4 L, CO2 insufflated without difficulty.  Visualization reveals a normal appendix, bowel adhesions to the left.  The right ovary is encased in filmy adhesions consistent with pelvic endometriosis.  In addition, the left ovary was also stuck to the back wall of the uterus and coated with endometriosis.  The posterior uterine wall and fundus are coated with vesicular implants of endometriosis.  In addition, there are some endometriotic implants in the cul-de-sac, anterior cul-de-sac however is plain.  At this time, 2 additional ports were placed 1 on the left, 1 on the right, 5 mm assistant port on the left.  The robot was docked in a standard fashion after achieving maximum Trendelenburg position.  At this time having entered the Endoshears with PK forceps and lysis of adhesions, bowel adhesions on the left were performed. Right ovarian cyst was opened and some serosanguineous fluid possible early  endometrioma was noted.  The cyst wall was extracted and sent to Pathology.  Good hemostasis was noted.  The posterior wall of the uterus is notable for vesicular implants.  First of all, the left ovary is freed from the back wall of the uterus.  It was encased in filmy peritoneal/endometriotic appearing adhesions performed with sharp excision.  These endometriotic implants on the back wall of the uterus are excised sharply and then cauterization and ablation of surface implants was performed.  Cul-de-sac endometriosis was excised as well. All of these small pieces of endometriosis were sent to Pathology in addition cyst wall for confirmation.  Irrigation was then accomplished. Good hemostasis was noted.  No further endometriosis was seen. Interceed is laid upon the back wall of the uterus and the left ovary. Good hemostasis was noted.  All instruments removed.  Robot was undocked.  CO2 was released.  Incisions were closed using 0 Vicryl, 4-0 Vicryl, and Dermabond.  RUMI retractor removed from the vagina.  The patient tolerated the procedure well and was transferred to recovery in good condition.     Lenoard Aden, M.D.     RJT/MEDQ  D:  01/03/2013  T:  01/04/2013  Job:  [id number]

## 2013-01-31 ENCOUNTER — Other Ambulatory Visit: Payer: Self-pay

## 2013-11-04 ENCOUNTER — Other Ambulatory Visit: Payer: Self-pay | Admitting: Dermatology

## 2013-12-05 ENCOUNTER — Other Ambulatory Visit: Payer: Self-pay | Admitting: Dermatology

## 2013-12-19 ENCOUNTER — Other Ambulatory Visit: Payer: Self-pay | Admitting: Family Medicine

## 2013-12-19 DIAGNOSIS — R109 Unspecified abdominal pain: Secondary | ICD-10-CM

## 2013-12-30 ENCOUNTER — Ambulatory Visit
Admission: RE | Admit: 2013-12-30 | Discharge: 2013-12-30 | Disposition: A | Discharge status: Home / Self Care | Payer: 59 | Source: Ambulatory Visit | Attending: Family Medicine | Admitting: Family Medicine

## 2013-12-30 DIAGNOSIS — R109 Unspecified abdominal pain: Secondary | ICD-10-CM

## 2013-12-30 MED ORDER — IOHEXOL 300 MG/ML  SOLN
100.0000 mL | Freq: Once | INTRAMUSCULAR | Status: AC | PRN
  Administered 2013-12-30: 100 mL via INTRAVENOUS

## 2014-01-10 ENCOUNTER — Other Ambulatory Visit: Payer: Self-pay

## 2014-02-17 ENCOUNTER — Other Ambulatory Visit: Payer: Self-pay | Admitting: Dermatology

## 2015-02-03 IMAGING — CT CT ABD-PELV W/ CM
2 of 4 series · 17 of 46 positions shown, 19 images · IV contrast (omnipaque)
Comparison: None.

CLINICAL DATA: Ongoing intermittent right flank pain radiating to
the back and right mid abdomen. Right flank pain Q52.1 (P3X-OJ-CM.

EXAM:
CT ABDOMEN AND PELVIS WITH CONTRAST
TECHNIQUE: Multidetector CT imaging of the abdomen and pelvis was performed
using the standard protocol following bolus administration of
intravenous contrast.
CONTRAST:  100 cc Omnipaque 300.

[Series 2: abd/pelvis with · axial · 0.66mm/px · z∈[-348,-13]mm · 14 of 73 slices shown, 16 images]
[im 3/73  soft-tissue]
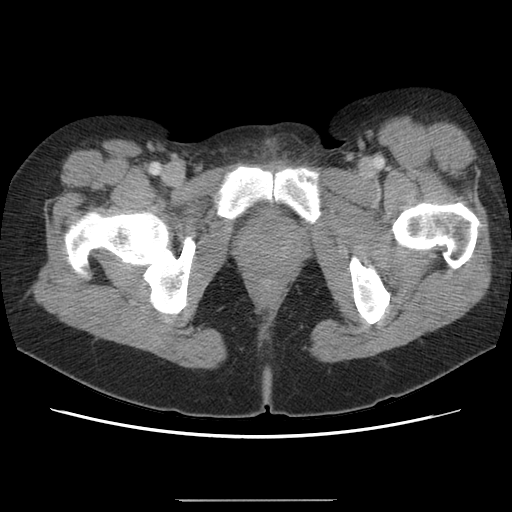
[im 3/73  bone]
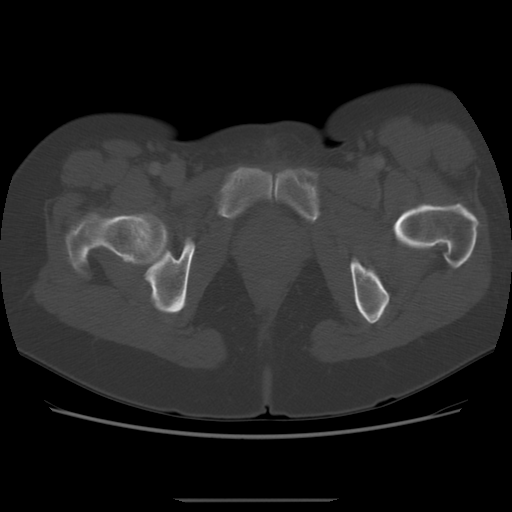
[im 9/73  soft-tissue]
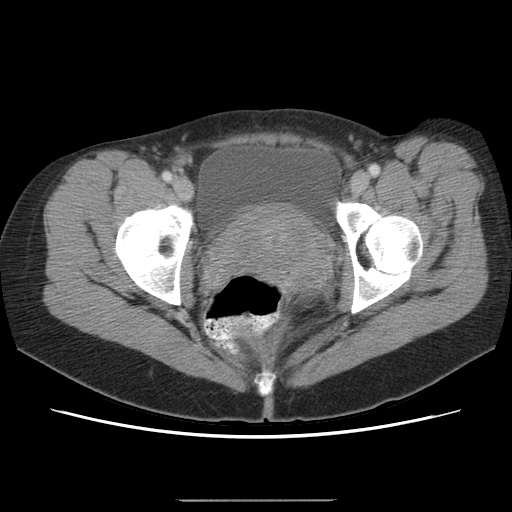
[im 15/73  soft-tissue]
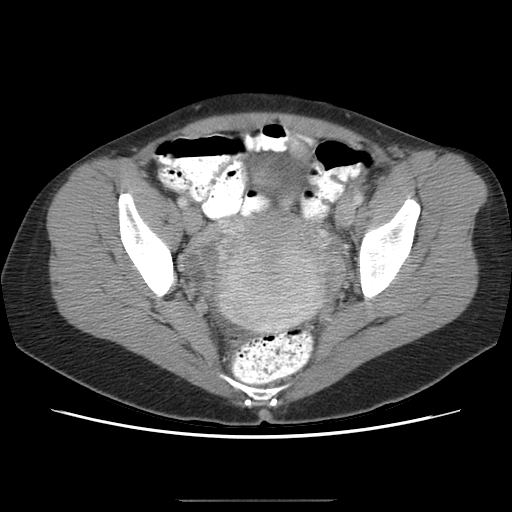
[im 21/73  soft-tissue]
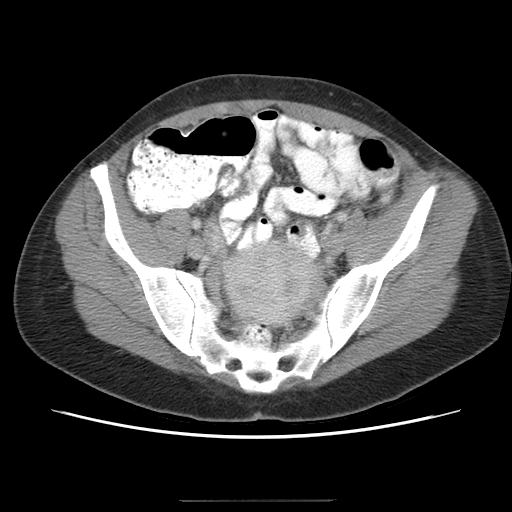
[im 24/73  soft-tissue]
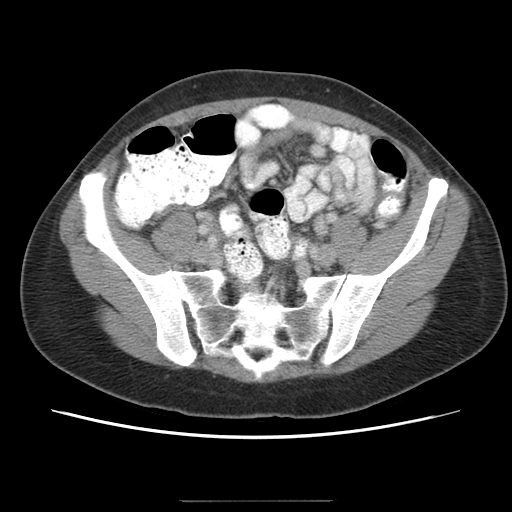
[im 29/73  soft-tissue]
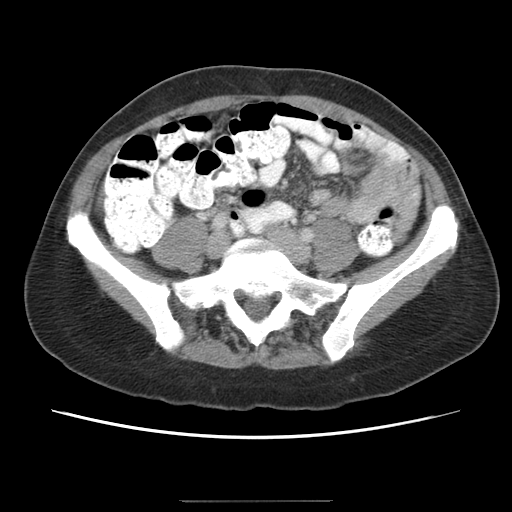
[im 35/73  soft-tissue]
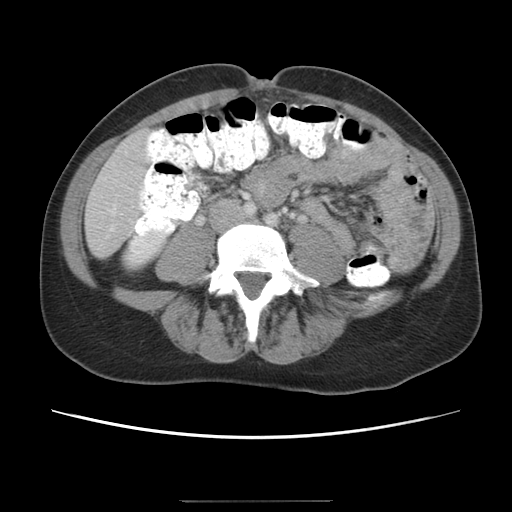
[im 38/73  soft-tissue]
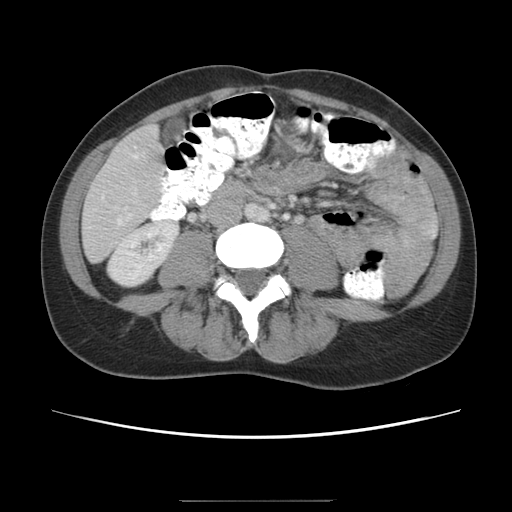
[im 44/73  soft-tissue]
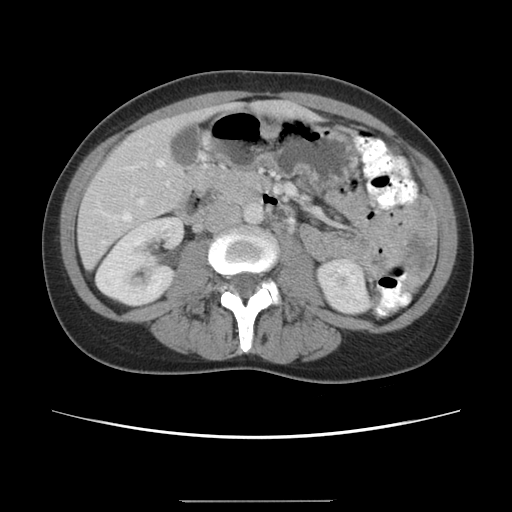
[im 44/73  bone]
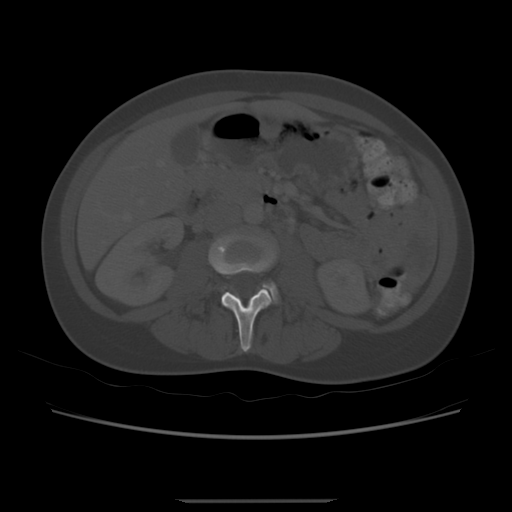
[im 49/73  soft-tissue]
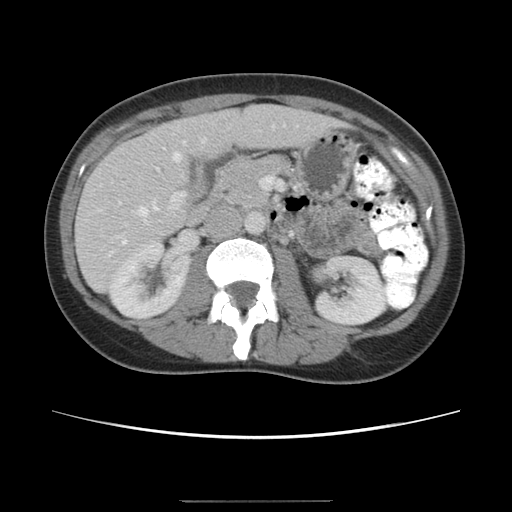
[im 55/73  soft-tissue]
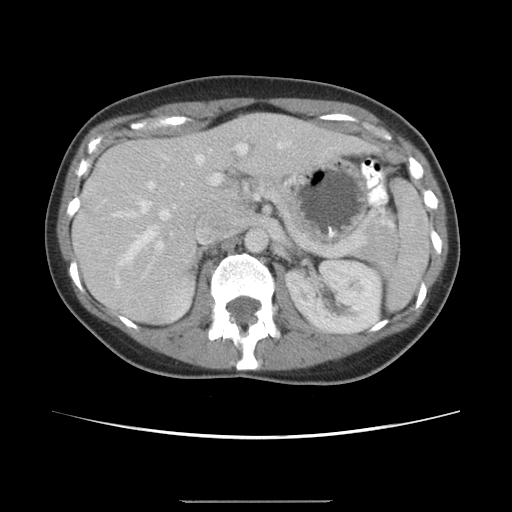
[im 58/73  soft-tissue]
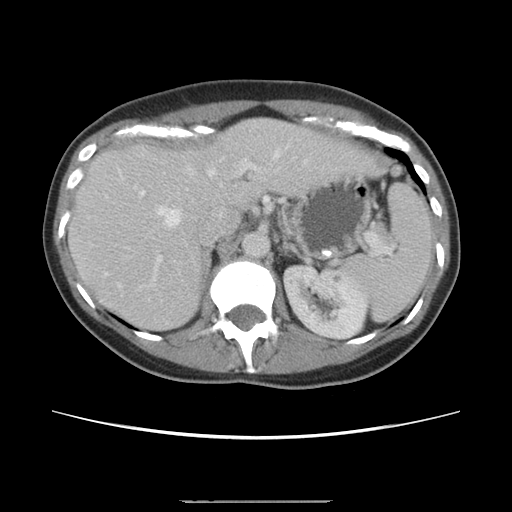
[im 64/73  soft-tissue]
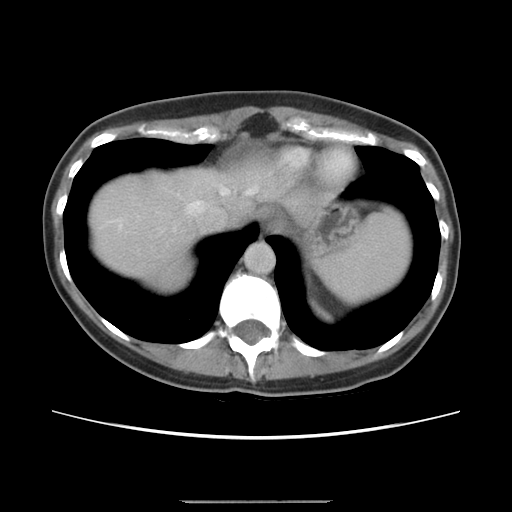
[im 70/73  soft-tissue]
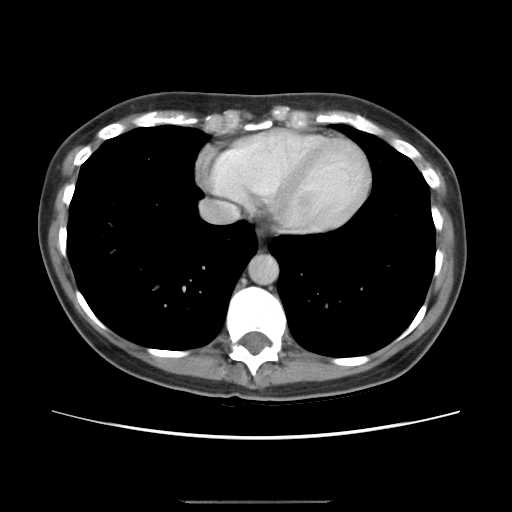

[Series 400: cor · coronal · 0.82mm/px · 3 of 124 slices shown]
[im 42/124  soft-tissue]
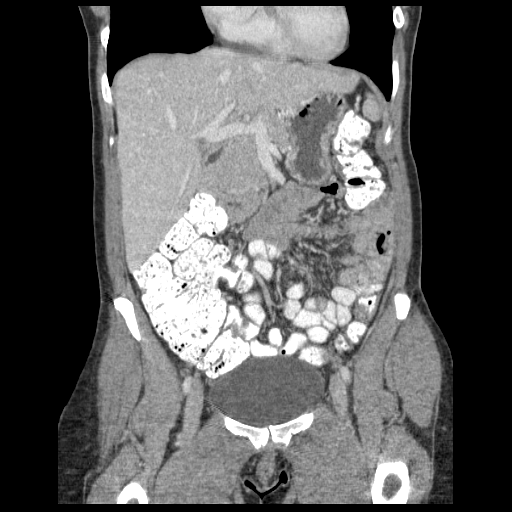
[im 55/124  soft-tissue]
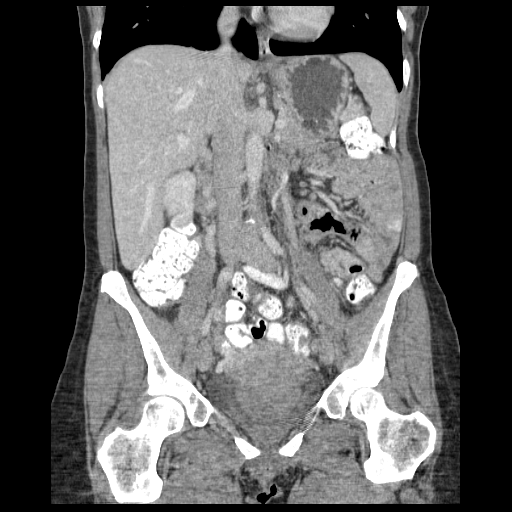
[im 69/124  soft-tissue]
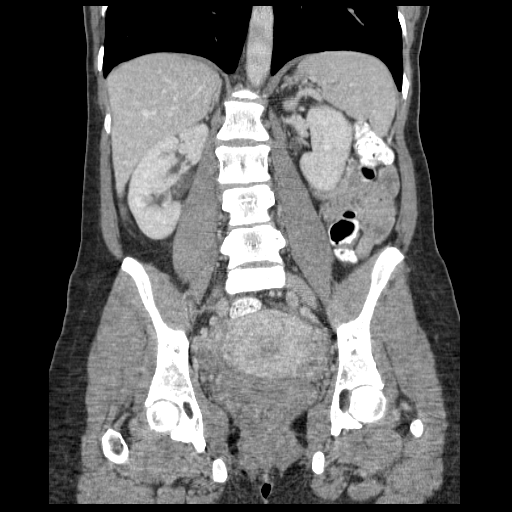

[17 of 46 positions shown; findings below may reference images not displayed]

FINDINGS: Lower chest: Lung bases show no acute findings. Heart size normal.
No pericardial or pleural effusion.

Hepatobiliary: Liver measures 19.0 cm. Liver and gallbladder are
otherwise unremarkable. No biliary ductal dilatation.

Pancreas: Negative.

Spleen: Negative.

Adrenals/Urinary Tract: Adrenal glands and right kidney are
unremarkable. 5 mm low-attenuation lesion in the upper pole left
kidney is too small to definitively characterize but likely
represents a cyst. Ureters are decompressed. Bladder is
unremarkable.

Stomach/Bowel: Stomach, small bowel, appendix (series 2, image 54
and coronal image 49) and colon are unremarkable.

Vascular/Lymphatic: Vascular structures are unremarkable. No
pathologically enlarged lymph nodes.

Reproductive: Uterus and ovaries are visualized.

Other: There may be trace pelvic free fluid.

Musculoskeletal: No worrisome lytic or sclerotic lesions.
IMPRESSION: 1. No acute findings to explain the patient's pain.
2. Liver is slightly enlarged.

## 2015-04-23 ENCOUNTER — Ambulatory Visit (INDEPENDENT_AMBULATORY_CARE_PROVIDER_SITE_OTHER): Payer: Managed Care, Other (non HMO) | Admitting: Podiatry

## 2015-04-23 ENCOUNTER — Encounter: Payer: Self-pay | Admitting: Podiatry

## 2015-04-23 VITALS — BP 150/96 | HR 79 | Resp 12

## 2015-04-23 DIAGNOSIS — L603 Nail dystrophy: Secondary | ICD-10-CM | POA: Diagnosis not present

## 2015-04-23 NOTE — Progress Notes (Signed)
She presents today with a chief complaint of painful toes hallux bilateral. States that the toenail seem to be growing out from a previous injury which resulted in the need to remove her toenails. She went to a different podiatrist remove the toenails and this was quite a traumatic experience for her. As these toenails have grown out since September they have started to grow in as they pushed through the distal aspect of the toe. She wants to know if this is normal and what we need to do to alleviate the symptoms she states that she saw her primary provider who placed her on Bactroban ointment and seems to be helping.  Objective: Vital signs are stable she is alert and oriented 3. I have reviewed her past medical history medications allergy surgery social history and review of systems. Pulses are strongly palpable. No acute distress. Neurologic sensory is intact percent was the monofilament. Degenerative reflexes are intact. Muscle strength equal bilateral. Orthopedic evaluation of his wrist rectus foot type no erythema and he was illustrated or odor cutaneous evaluation other than the mild erythema at the distal aspect of the hallux bilaterally where the nail plate appears to be growing out. The leading edge of the nail is causing inflammation and tenderness as it grows out.  Assessment: Mild paronychia distal aspect of the hallux bilateral associated with the nail growing out.  Plan: I encouraged her to continue the use of the Bactroban ointment and to soak in Epsom salts and warm water. I also encouraged her not to wear shoes that is too tight. Follow-up with her should this worsen.

## 2015-04-27 ENCOUNTER — Telehealth: Payer: Self-pay | Admitting: *Deleted

## 2015-04-27 NOTE — Telephone Encounter (Addendum)
Pt states she is having pain while wearing the required high-heeled shoe, due to toenail injury and removal. I left message informing pt could write a note stating she should wear athletic type shoes for 1 month due to comfort after a surgical procedure.  I ask pt to call with more information. 05/05/2015-PT LEFT MESSAGE WITH THE fax number to send note concerning wearing athletic style shoes for 1 month after toenail procedure.  Faxed letter to [phone].  Informed pt letter was sent.

## 2015-05-05 ENCOUNTER — Encounter: Payer: Self-pay | Admitting: *Deleted

## 2015-06-22 ENCOUNTER — Other Ambulatory Visit: Payer: Self-pay | Admitting: Obstetrics and Gynecology

## 2015-06-23 NOTE — Patient Instructions (Signed)
Your procedure is scheduled on:  Monday, July 06, 2015  Enter through the Main Entrance of [facility] at:  11:30 AM  Pick up the phone at the desk and dial [phone].  Call this number if you have problems the morning of surgery: [phone].  Remember: Do NOT eat food or drink after:  Midnight Sunday  Take these medicines the morning of surgery with a SIP OF WATER:  Labetalol  Do NOT wear jewelry (body piercing), metal hair clips/bobby pins, make-up, or nail polish. Do NOT wear lotions, powders, or perfumes.  You may wear deodorant. Do NOT shave for 48 hours prior to surgery. Do NOT bring valuables to the hospital. Contacts, dentures, or bridgework may not be worn into surgery.  Leave suitcase in car.  After surgery it may be brought to your room.  For patients admitted to the hospital, checkout time is 11:00 AM the day of discharge.

## 2015-06-24 ENCOUNTER — Encounter (HOSPITAL_COMMUNITY)
Admission: RE | Admit: 2015-06-24 | Discharge: 2015-06-24 | Disposition: A | Discharge status: Home / Self Care | Payer: Managed Care, Other (non HMO) | Source: Ambulatory Visit | Attending: Obstetrics and Gynecology | Admitting: Obstetrics and Gynecology

## 2015-06-24 ENCOUNTER — Encounter (HOSPITAL_COMMUNITY): Payer: Self-pay

## 2015-06-24 DIAGNOSIS — Z01812 Encounter for preprocedural laboratory examination: Secondary | ICD-10-CM | POA: Insufficient documentation

## 2015-06-24 HISTORY — DX: Headache, unspecified: R51.9

## 2015-06-24 HISTORY — DX: Malignant (primary) neoplasm, unspecified: C80.1

## 2015-06-24 HISTORY — DX: Headache: R51

## 2015-06-24 LAB — CBC
HEMATOCRIT: 39.9 % (ref 36.0–46.0)
HEMOGLOBIN: 14 g/dL (ref 12.0–15.0)
MCH: 31.2 pg (ref 26.0–34.0)
MCHC: 35.1 g/dL (ref 30.0–36.0)
MCV: 88.9 fL (ref 78.0–100.0)
Platelets: 242 10*3/uL (ref 150–400)
RBC: 4.49 MIL/uL (ref 3.87–5.11)
RDW: 12.9 % (ref 11.5–15.5)
WBC: 7.3 10*3/uL (ref 4.0–10.5)

## 2015-06-24 LAB — BASIC METABOLIC PANEL
ANION GAP: 8 (ref 5–15)
BUN: 14 mg/dL (ref 6–20)
CALCIUM: 9 mg/dL (ref 8.9–10.3)
CO2: 28 mmol/L (ref 22–32)
Chloride: 103 mmol/L (ref 101–111)
Creatinine, Ser: 0.66 mg/dL (ref 0.44–1.00)
GLUCOSE: 89 mg/dL (ref 65–99)
POTASSIUM: 4.3 mmol/L (ref 3.5–5.1)
Sodium: 139 mmol/L (ref 135–145)

## 2015-07-05 NOTE — H&P (Deleted)
Brittany [name], BOSQUES NO.:  [id number]  MEDICAL RECORD NO.:  [id number]  LOCATION:  [facility]                           FACILITY:  [facility]  PHYSICIAN:  Lovenia Kim, M.D.DATE OF BIRTH:  [birth year]  DATE OF ADMISSION:  06/05/2015 DATE OF DISCHARGE:  06/06/2015                             HISTORY & PHYSICAL   CHIEF COMPLAINT:  Pelvic pain, dysmenorrhea, history of pelvic endometriosis.  HISTORY:  She is a 40 year old white female, G3, P3, who presents now for definitive therapy of endometriosis.  PAST MEDICAL HISTORY:  Remarkable for; 1. Hypertension. 2. Endometriosis. 3. Dysmenorrhea.  ALLERGIES:  She has allergies to MACROBID, ERYTHROMYCIN, AND ADHESIVE TAPE.  FAMILY HISTORY:  Heart disease, lupus, diabetes, stroke, and chronic hypertension.  SOCIAL HISTORY:  Noncontributory.  OB/GYN HISTORY:  History of vaginal delivery x3.  PAST SURGICAL HISTORY:  Toe nail removal, mole removal, laparoscopy in 2014 for ablation and excision of endometriosis, also had laparoscopy in 1999.  MEDICATIONS:  Include; 1. Percocet as needed. 2. Labetalol. 3. Birth control pills.  PHYSICAL EXAMINATION:  GENERAL:  She is a well-developed, well- nourished, white female, in no acute distress. HEENT:  Normal. NECK:  Supple.  Full range of motion. LUNGS:  Clear. HEART:  Regular rate and rhythm. ABDOMEN:  Soft. PELVIC:  Reveals bilateral tenderness in both lower quadrants.  A retroflexed uterus with a small pedunculated fibroid noted bilateral normal, but tender adnexa. EXTREMITIES:  There are no cords. NEUROLOGIC:  Nonfocal. SKIN:  Intact.  IMPRESSION:  Symptomatic recurrent pelvic endometriosis for definitive therapy.  PLAN:  Da Vinci assisted total laparoscopic hysterectomy, bilateral salpingectomy, excision and ablation of endometriosis.  Risks of anesthesia, infection, bleeding, injury to surrounding organs, possible need for repair was discussed.  Delayed versus  immediate complications to include bowel and bladder injury noted.  The patient acknowledges and wishes to proceed.     Lovenia Kim, M.D.     RJT/MEDQ  D:  07/05/2015  T:  07/05/2015  Job:  [id number]

## 2015-07-05 NOTE — H&P (Signed)
NAMEDEBRAANN, [name] NO.:  [id number]  MEDICAL RECORD NO.:  [id number]  LOCATION:  [facility]                           FACILITY:  [facility]  PHYSICIAN:  Lovenia Kim, M.D.DATE OF BIRTH:  01/05/1976  DATE OF ADMISSION:  06/05/2015 DATE OF DISCHARGE:  06/06/2015                             HISTORY & PHYSICAL   CHIEF COMPLAINT:  Pelvic pain, dysmenorrhea, history of pelvic endometriosis.  HISTORY:  She is a 40 year old white female, G3, P3, who presents now for definitive therapy of endometriosis.  PAST MEDICAL HISTORY:  Remarkable for; 1. Hypertension. 2. Endometriosis. 3. Dysmenorrhea.  ALLERGIES:  She has allergies to MACROBID, ERYTHROMYCIN, AND ADHESIVE TAPE.  FAMILY HISTORY:  Heart disease, lupus, diabetes, stroke, and chronic hypertension.  SOCIAL HISTORY:  Noncontributory.  OB/GYN HISTORY:  History of vaginal delivery x3.  PAST SURGICAL HISTORY:  Toe nail removal, mole removal, laparoscopy in 2014 for ablation and excision of endometriosis, also had laparoscopy in 1999.  MEDICATIONS:  Include; 1. Percocet as needed. 2. Labetalol. 3. Birth control pills.  PHYSICAL EXAMINATION:  GENERAL:  She is a well-developed, well- nourished, white female, in no acute distress. HEENT:  Normal. NECK:  Supple.  Full range of motion. LUNGS:  Clear. HEART:  Regular rate and rhythm. ABDOMEN:  Soft. PELVIC:  Reveals bilateral tenderness in both lower quadrants.  A retroflexed uterus with a small pedunculated fibroid noted bilateral normal, but tender adnexa. EXTREMITIES:  There are no cords. NEUROLOGIC:  Nonfocal. SKIN:  Intact.  IMPRESSION:  Symptomatic recurrent pelvic endometriosis for definitive therapy.  PLAN:  Da Vinci assisted total laparoscopic hysterectomy, bilateral salpingectomy, excision and ablation of endometriosis.  Risks of anesthesia, infection, bleeding, injury to surrounding organs, possible need for repair was discussed.  Delayed versus  immediate complications to include bowel and bladder injury noted.  The patient acknowledges and wishes to proceed.     Lovenia Kim, M.D.     RJT/MEDQ  D:  07/05/2015  T:  07/05/2015  Job:  [id number]

## 2015-07-06 ENCOUNTER — Ambulatory Visit (HOSPITAL_COMMUNITY): Payer: Managed Care, Other (non HMO) | Admitting: Certified Registered Nurse Anesthetist

## 2015-07-06 ENCOUNTER — Encounter (HOSPITAL_COMMUNITY): Payer: Self-pay | Admitting: Emergency Medicine

## 2015-07-06 ENCOUNTER — Encounter (HOSPITAL_COMMUNITY)
Admission: RE | Disposition: A | Discharge status: Home / Self Care | Payer: Self-pay | Source: Ambulatory Visit | Attending: Obstetrics and Gynecology

## 2015-07-06 ENCOUNTER — Ambulatory Visit (HOSPITAL_COMMUNITY)
Admission: RE | Admit: 2015-07-06 | Discharge: 2015-07-07 | Disposition: A | Discharge status: Home / Self Care | Payer: Managed Care, Other (non HMO) | Source: Ambulatory Visit | Attending: Obstetrics and Gynecology | Admitting: Obstetrics and Gynecology

## 2015-07-06 DIAGNOSIS — R102 Pelvic and perineal pain: Secondary | ICD-10-CM | POA: Diagnosis not present

## 2015-07-06 DIAGNOSIS — N946 Dysmenorrhea, unspecified: Secondary | ICD-10-CM | POA: Diagnosis present

## 2015-07-06 DIAGNOSIS — N920 Excessive and frequent menstruation with regular cycle: Secondary | ICD-10-CM | POA: Diagnosis not present

## 2015-07-06 DIAGNOSIS — N800 Endometriosis of uterus: Secondary | ICD-10-CM | POA: Insufficient documentation

## 2015-07-06 DIAGNOSIS — D252 Subserosal leiomyoma of uterus: Secondary | ICD-10-CM | POA: Diagnosis not present

## 2015-07-06 DIAGNOSIS — D251 Intramural leiomyoma of uterus: Secondary | ICD-10-CM | POA: Diagnosis not present

## 2015-07-06 DIAGNOSIS — I10 Essential (primary) hypertension: Secondary | ICD-10-CM | POA: Insufficient documentation

## 2015-07-06 DIAGNOSIS — N803 Endometriosis of pelvic peritoneum: Secondary | ICD-10-CM | POA: Insufficient documentation

## 2015-07-06 HISTORY — PX: ROBOTIC ASSISTED TOTAL HYSTERECTOMY WITH SALPINGECTOMY: SHX6679

## 2015-07-06 LAB — HCG, SERUM, QUALITATIVE: PREG SERUM: NEGATIVE

## 2015-07-06 SURGERY — ROBOTIC ASSISTED TOTAL HYSTERECTOMY WITH SALPINGECTOMY
Anesthesia: General | Laterality: Bilateral

## 2015-07-06 MED ORDER — SODIUM CHLORIDE 0.9 % IJ SOLN
INTRAMUSCULAR | Status: DC | PRN
  Administered 2015-07-06: 10 mL

## 2015-07-06 MED ORDER — LACTATED RINGERS IV SOLN
INTRAVENOUS | Status: DC
  Administered 2015-07-06 (×2): via INTRAVENOUS

## 2015-07-06 MED ORDER — SODIUM CHLORIDE 0.9% FLUSH: 9.0000 mL | INTRAVENOUS | Status: DC | PRN

## 2015-07-06 MED ORDER — DIPHENHYDRAMINE HCL 12.5 MG/5ML PO ELIX: 12.5000 mg | ORAL_SOLUTION | Freq: Four times a day (QID) | ORAL | Status: DC | PRN

## 2015-07-06 MED ORDER — ONDANSETRON HCL 4 MG/2ML IJ SOLN
INTRAMUSCULAR | Status: AC
  Filled 2015-07-06: qty 2

## 2015-07-06 MED ORDER — MIDAZOLAM HCL 2 MG/2ML IJ SOLN
INTRAMUSCULAR | Status: AC
Start: 2015-07-06 — End: 2015-07-06
  Administered 2015-07-06: 0.5 mg via INTRAVENOUS
  Filled 2015-07-06: qty 2

## 2015-07-06 MED ORDER — BUPIVACAINE HCL (PF) 0.25 % IJ SOLN
INTRAMUSCULAR | Status: DC | PRN
  Administered 2015-07-06: 6 mL

## 2015-07-06 MED ORDER — HYDROMORPHONE 1 MG/ML IV SOLN
INTRAVENOUS | Status: DC
  Administered 2015-07-06: 18:00:00 via INTRAVENOUS
  Administered 2015-07-06: 2.6 mg via INTRAVENOUS
  Administered 2015-07-07: 0.6 mg via INTRAVENOUS
  Administered 2015-07-07: 0.2 mg via INTRAVENOUS
  Filled 2015-07-06: qty 25

## 2015-07-06 MED ORDER — ONDANSETRON HCL 4 MG/2ML IJ SOLN
INTRAMUSCULAR | Status: DC | PRN
  Administered 2015-07-06: 4 mg via INTRAVENOUS

## 2015-07-06 MED ORDER — SODIUM CHLORIDE 0.9 % IV SOLN
INTRAVENOUS | Status: DC | PRN
  Administered 2015-07-06: 120 mL

## 2015-07-06 MED ORDER — NEOSTIGMINE METHYLSULFATE 10 MG/10ML IV SOLN
INTRAVENOUS | Status: DC | PRN
  Administered 2015-07-06: 3 mg via INTRAVENOUS

## 2015-07-06 MED ORDER — DEXTROSE IN LACTATED RINGERS 5 % IV SOLN
INTRAVENOUS | Status: DC
  Administered 2015-07-06 – 2015-07-07 (×2): via INTRAVENOUS

## 2015-07-06 MED ORDER — ARTIFICIAL TEARS OP OINT
TOPICAL_OINTMENT | OPHTHALMIC | Status: DC | PRN
  Administered 2015-07-06: 1 via OPHTHALMIC

## 2015-07-06 MED ORDER — CEFAZOLIN SODIUM-DEXTROSE 2-4 GM/100ML-% IV SOLN
2.0000 g | INTRAVENOUS | Status: AC
  Administered 2015-07-06: 2 g via INTRAVENOUS
  Filled 2015-07-06: qty 100

## 2015-07-06 MED ORDER — METOCLOPRAMIDE HCL 5 MG/ML IJ SOLN
INTRAMUSCULAR | Status: AC
  Filled 2015-07-06: qty 2

## 2015-07-06 MED ORDER — PROPOFOL 10 MG/ML IV BOLUS
INTRAVENOUS | Status: AC
  Filled 2015-07-06: qty 20

## 2015-07-06 MED ORDER — DIPHENHYDRAMINE HCL 50 MG/ML IJ SOLN: 12.5000 mg | Freq: Four times a day (QID) | INTRAMUSCULAR | Status: DC | PRN

## 2015-07-06 MED ORDER — NEOSTIGMINE METHYLSULFATE 10 MG/10ML IV SOLN
INTRAVENOUS | Status: AC
  Filled 2015-07-06: qty 1

## 2015-07-06 MED ORDER — BUPIVACAINE HCL (PF) 0.25 % IJ SOLN
INTRAMUSCULAR | Status: AC
  Filled 2015-07-06: qty 30

## 2015-07-06 MED ORDER — SCOPOLAMINE 1 MG/3DAYS TD PT72
1.0000 | MEDICATED_PATCH | Freq: Once | TRANSDERMAL | Status: DC
  Administered 2015-07-06: 1.5 mg via TRANSDERMAL

## 2015-07-06 MED ORDER — FENTANYL CITRATE (PF) 250 MCG/5ML IJ SOLN
INTRAMUSCULAR | Status: AC
  Filled 2015-07-06: qty 5

## 2015-07-06 MED ORDER — MIDAZOLAM HCL 2 MG/2ML IJ SOLN
INTRAMUSCULAR | Status: DC | PRN
  Administered 2015-07-06: 2 mg via INTRAVENOUS

## 2015-07-06 MED ORDER — ROPIVACAINE HCL 5 MG/ML IJ SOLN
INTRAMUSCULAR | Status: AC
  Filled 2015-07-06: qty 30

## 2015-07-06 MED ORDER — MIDAZOLAM HCL 2 MG/2ML IJ SOLN: 0.5000 mg | Freq: Once | INTRAMUSCULAR | Status: DC

## 2015-07-06 MED ORDER — ROCURONIUM BROMIDE 100 MG/10ML IV SOLN
INTRAVENOUS | Status: AC
  Filled 2015-07-06: qty 1

## 2015-07-06 MED ORDER — FENTANYL CITRATE (PF) 100 MCG/2ML IJ SOLN
INTRAMUSCULAR | Status: DC | PRN
  Administered 2015-07-06 (×7): 50 ug via INTRAVENOUS

## 2015-07-06 MED ORDER — LIDOCAINE HCL (CARDIAC) 20 MG/ML IV SOLN
INTRAVENOUS | Status: AC
Start: 2015-07-06 — End: 2015-07-06
  Filled 2015-07-06: qty 5

## 2015-07-06 MED ORDER — SODIUM CHLORIDE 0.9 % IJ SOLN
INTRAMUSCULAR | Status: AC
  Filled 2015-07-06: qty 20

## 2015-07-06 MED ORDER — DEXAMETHASONE SODIUM PHOSPHATE 4 MG/ML IJ SOLN
INTRAMUSCULAR | Status: AC
Start: 2015-07-06 — End: 2015-07-06
  Filled 2015-07-06: qty 1

## 2015-07-06 MED ORDER — FENTANYL CITRATE (PF) 100 MCG/2ML IJ SOLN
INTRAMUSCULAR | Status: AC
  Filled 2015-07-06: qty 2

## 2015-07-06 MED ORDER — SCOPOLAMINE 1 MG/3DAYS TD PT72
MEDICATED_PATCH | TRANSDERMAL | Status: AC
  Administered 2015-07-06: 1.5 mg via TRANSDERMAL
  Filled 2015-07-06: qty 1

## 2015-07-06 MED ORDER — GLYCOPYRROLATE 0.2 MG/ML IJ SOLN
INTRAMUSCULAR | Status: DC | PRN
  Administered 2015-07-06: 0.4 mg via INTRAVENOUS

## 2015-07-06 MED ORDER — TRAMADOL HCL 50 MG PO TABS: 50.0000 mg | ORAL_TABLET | Freq: Four times a day (QID) | ORAL | Status: DC | PRN

## 2015-07-06 MED ORDER — LABETALOL HCL 100 MG PO TABS: 100.0000 mg | ORAL_TABLET | Freq: Two times a day (BID) | ORAL | Status: DC

## 2015-07-06 MED ORDER — MIDAZOLAM HCL 2 MG/2ML IJ SOLN
INTRAMUSCULAR | Status: AC
  Filled 2015-07-06: qty 2

## 2015-07-06 MED ORDER — HYDROMORPHONE HCL 1 MG/ML IJ SOLN
INTRAMUSCULAR | Status: AC
  Filled 2015-07-06: qty 1

## 2015-07-06 MED ORDER — LACTATED RINGERS IR SOLN
Status: DC | PRN
  Administered 2015-07-06: 3000 mL

## 2015-07-06 MED ORDER — OXYCODONE-ACETAMINOPHEN 5-325 MG PO TABS
1.0000 | ORAL_TABLET | ORAL | Status: DC | PRN
  Administered 2015-07-07: 2 via ORAL
  Filled 2015-07-06: qty 2

## 2015-07-06 MED ORDER — ONDANSETRON HCL 4 MG/2ML IJ SOLN
4.0000 mg | Freq: Once | INTRAMUSCULAR | Status: AC
  Administered 2015-07-06: 4 mg via INTRAVENOUS

## 2015-07-06 MED ORDER — LIDOCAINE HCL (CARDIAC) 20 MG/ML IV SOLN
INTRAVENOUS | Status: DC | PRN
  Administered 2015-07-06: 60 mg via INTRAVENOUS

## 2015-07-06 MED ORDER — HYDROMORPHONE HCL 1 MG/ML IJ SOLN
0.2500 mg | INTRAMUSCULAR | Status: DC | PRN
  Administered 2015-07-06: 0.5 mg via INTRAVENOUS

## 2015-07-06 MED ORDER — DEXAMETHASONE SODIUM PHOSPHATE 10 MG/ML IJ SOLN
INTRAMUSCULAR | Status: DC | PRN
  Administered 2015-07-06: 4 mg via INTRAVENOUS

## 2015-07-06 MED ORDER — NALOXONE HCL 0.4 MG/ML IJ SOLN: 0.4000 mg | INTRAMUSCULAR | Status: DC | PRN

## 2015-07-06 MED ORDER — PROPOFOL 10 MG/ML IV BOLUS
INTRAVENOUS | Status: DC | PRN
  Administered 2015-07-06: 150 mg via INTRAVENOUS

## 2015-07-06 MED ORDER — ROCURONIUM BROMIDE 100 MG/10ML IV SOLN
INTRAVENOUS | Status: DC | PRN
  Administered 2015-07-06 (×2): 10 mg via INTRAVENOUS
  Administered 2015-07-06: 50 mg via INTRAVENOUS

## 2015-07-06 MED ORDER — METOCLOPRAMIDE HCL 5 MG/ML IJ SOLN
10.0000 mg | Freq: Once | INTRAMUSCULAR | Status: AC
  Administered 2015-07-06: 10 mg via INTRAVENOUS

## 2015-07-06 MED ORDER — FENTANYL CITRATE (PF) 100 MCG/2ML IJ SOLN
INTRAMUSCULAR | Status: AC
  Administered 2015-07-06: 100 ug
  Filled 2015-07-06: qty 2

## 2015-07-06 MED ORDER — ONDANSETRON HCL 4 MG/2ML IJ SOLN
4.0000 mg | Freq: Four times a day (QID) | INTRAMUSCULAR | Status: DC | PRN
  Administered 2015-07-07: 4 mg via INTRAVENOUS
  Filled 2015-07-06: qty 2

## 2015-07-06 MED ORDER — KETOROLAC TROMETHAMINE 30 MG/ML IJ SOLN
INTRAMUSCULAR | Status: AC
  Filled 2015-07-06: qty 1

## 2015-07-06 MED ORDER — SODIUM CHLORIDE 0.9 % IJ SOLN
INTRAMUSCULAR | Status: AC
  Filled 2015-07-06: qty 50

## 2015-07-06 MED ORDER — GLYCOPYRROLATE 0.2 MG/ML IJ SOLN
INTRAMUSCULAR | Status: AC
  Filled 2015-07-06: qty 2

## 2015-07-06 MED ORDER — ARTIFICIAL TEARS OP OINT
TOPICAL_OINTMENT | OPHTHALMIC | Status: AC
  Filled 2015-07-06: qty 3.5

## 2015-07-06 MED ORDER — FENTANYL CITRATE (PF) 100 MCG/2ML IJ SOLN
50.0000 ug | Freq: Once | INTRAMUSCULAR | Status: AC
  Administered 2015-07-06: 50 ug via INTRAVENOUS

## 2015-07-06 MED ORDER — KETOROLAC TROMETHAMINE 30 MG/ML IJ SOLN
INTRAMUSCULAR | Status: DC | PRN
  Administered 2015-07-06: 30 mg via INTRAVENOUS

## 2015-07-06 MED ORDER — CEFAZOLIN SODIUM-DEXTROSE 2-3 GM-% IV SOLR
INTRAVENOUS | Status: AC
  Filled 2015-07-06: qty 50

## 2015-07-06 SURGICAL SUPPLY — 53 items
BARRIER ADHS 3X4 INTERCEED (GAUZE/BANDAGES/DRESSINGS) ×2 IMPLANT
BRR ADH 4X3 ABS CNTRL BYND (GAUZE/BANDAGES/DRESSINGS) ×1
CATH FOLEY 3WAY  5CC 16FR (CATHETERS) ×1
CATH FOLEY 3WAY 5CC 16FR (CATHETERS) ×1 IMPLANT
CELL SAVER LIPIGURD (MISCELLANEOUS) IMPLANT
CLOTH BEACON ORANGE TIMEOUT ST (SAFETY) ×2 IMPLANT
CONT PATH 16OZ SNAP LID 3702 (MISCELLANEOUS) ×2 IMPLANT
COVER BACK TABLE 60X90IN (DRAPES) ×4 IMPLANT
COVER TIP SHEARS 8 DVNC (MISCELLANEOUS) ×1 IMPLANT
COVER TIP SHEARS 8MM DA VINCI (MISCELLANEOUS) ×1
DECANTER SPIKE VIAL GLASS SM (MISCELLANEOUS) ×8 IMPLANT
DEVICE RETRIEVAL ALEXIS 14 (MISCELLANEOUS) IMPLANT
DURAPREP 26ML APPLICATOR (WOUND CARE) ×2 IMPLANT
ELECT REM PT RETURN 9FT ADLT (ELECTROSURGICAL) ×2
ELECTRODE REM PT RTRN 9FT ADLT (ELECTROSURGICAL) ×1 IMPLANT
EXTRT SYSTEM ALEXIS 14CM (MISCELLANEOUS)
GAUZE VASELINE 3X9 (GAUZE/BANDAGES/DRESSINGS) IMPLANT
GLOVE BIO SURGEON STRL SZ7.5 (GLOVE) ×4 IMPLANT
GLOVE BIOGEL PI IND STRL 7.0 (GLOVE) ×3 IMPLANT
GLOVE BIOGEL PI INDICATOR 7.0 (GLOVE) ×3
KIT ACCESSORY DA VINCI DISP (KITS) ×1
KIT ACCESSORY DVNC DISP (KITS) ×1 IMPLANT
LEGGING LITHOTOMY PAIR STRL (DRAPES) ×2 IMPLANT
LIQUID BAND (GAUZE/BANDAGES/DRESSINGS) ×2 IMPLANT
NEEDLE INSUFFLATION 150MM (ENDOMECHANICALS) ×2 IMPLANT
OCCLUDER COLPOPNEUMO (BALLOONS) ×2 IMPLANT
PACK ROBOT WH (CUSTOM PROCEDURE TRAY) ×2 IMPLANT
PACK ROBOTIC GOWN (GOWN DISPOSABLE) ×2 IMPLANT
PAD PREP 24X48 CUFFED NSTRL (MISCELLANEOUS) ×4 IMPLANT
PAD TRENDELENBURG POSITION (MISCELLANEOUS) ×2 IMPLANT
SET CYSTO W/LG BORE CLAMP LF (SET/KITS/TRAYS/PACK) IMPLANT
SET IRRIG TUBING LAPAROSCOPIC (IRRIGATION / IRRIGATOR) ×2 IMPLANT
SET TRI-LUMEN FLTR TB AIRSEAL (TUBING) ×1 IMPLANT
SUT VIC AB 0 CT1 27 (SUTURE) ×4
SUT VIC AB 0 CT1 27XBRD ANBCTR (SUTURE) ×2 IMPLANT
SUT VICRYL 0 UR6 27IN ABS (SUTURE) ×2 IMPLANT
SUT VICRYL RAPIDE 4/0 PS 2 (SUTURE) ×4 IMPLANT
SUT VLOC 180 0 6IN GS21 (SUTURE) ×1 IMPLANT
SUT VLOC 180 0 9IN  GS21 (SUTURE) ×1
SUT VLOC 180 0 9IN GS21 (SUTURE) IMPLANT
SYR 50ML LL SCALE MARK (SYRINGE) ×2 IMPLANT
SYRINGE 10CC LL (SYRINGE) ×2 IMPLANT
TIP RUMI ORANGE 6.7MMX12CM (TIP) IMPLANT
TIP UTERINE 5.1X6CM LAV DISP (MISCELLANEOUS) IMPLANT
TIP UTERINE 6.7X10CM GRN DISP (MISCELLANEOUS) ×1 IMPLANT
TIP UTERINE 6.7X6CM WHT DISP (MISCELLANEOUS) IMPLANT
TIP UTERINE 6.7X8CM BLUE DISP (MISCELLANEOUS) IMPLANT
TOWEL OR 17X24 6PK STRL BLUE (TOWEL DISPOSABLE) ×6 IMPLANT
TROCAR DISP BLADELESS 8 DVNC (TROCAR) ×1 IMPLANT
TROCAR DISP BLADELESS 8MM (TROCAR) ×1
TROCAR PORT AIRSEAL 5X120 (TROCAR) ×2 IMPLANT
TROCAR Z-THREAD 12X150 (TROCAR) ×2 IMPLANT
WATER STERILE IRR 1000ML POUR (IV SOLUTION) ×4 IMPLANT

## 2015-07-06 NOTE — Anesthesia Preprocedure Evaluation (Addendum)
Anesthesia Evaluation  Patient identified by MRN, date of birth, ID band Patient awake    Reviewed: Allergy & Precautions, NPO status , Patient's Chart, lab work & pertinent test results  History of Anesthesia Complications (+) PONV and history of anesthetic complications  Airway Mallampati: II  TM Distance: >3 FB Neck ROM: Full    Dental no notable dental hx.    Pulmonary neg pulmonary ROS,    Pulmonary exam normal breath sounds clear to auscultation       Cardiovascular hypertension, Pt. on medications and Pt. on home beta blockers negative cardio ROS Normal cardiovascular exam Rhythm:Regular Rate:Normal     Neuro/Psych  Headaches, negative psych ROS   GI/Hepatic negative GI ROS, Neg liver ROS,   Endo/Other  negative endocrine ROS  Renal/GU negative Renal ROS  negative genitourinary   Musculoskeletal negative musculoskeletal ROS (+)   Abdominal   Peds negative pediatric ROS (+)  Hematology negative hematology ROS (+)   Anesthesia Other Findings   Reproductive/Obstetrics negative OB ROS                             Anesthesia Physical Anesthesia Plan  ASA: II  Anesthesia Plan: General   Post-op Pain Management:    Induction: Intravenous  Airway Management Planned: Oral ETT  Additional Equipment:   Intra-op Plan:   Post-operative Plan: Extubation in OR  Informed Consent: I have reviewed the patients History and Physical, chart, labs and discussed the procedure including the risks, benefits and alternatives for the proposed anesthesia with the patient or authorized representative who has indicated his/her understanding and acceptance.   Dental advisory given  Plan Discussed with: CRNA  Anesthesia Plan Comments:         Anesthesia Quick Evaluation

## 2015-07-06 NOTE — Transfer of Care (Signed)
Immediate Anesthesia Transfer of Care Note  Patient: Brittany [name]  Procedure(s) Performed: Procedure(s): ROBOTIC ASSISTED TOTAL HYSTERECTOMY WITH SALPINGECTOMY/Ablation of Endometriosis (Bilateral)  Patient Location: PACU  Anesthesia Type:General  Level of Consciousness: awake, alert , oriented and patient cooperative  Airway & Oxygen Therapy: Patient Spontanous Breathing and Patient connected to nasal cannula oxygen  Post-op Assessment: Report given to RN and Post -op Vital signs reviewed and stable  Post vital signs: Reviewed and stable  Last Vitals:  Filed Vitals:   07/06/15 1145  BP: 128/89  Pulse: 91  Temp: 36.9 C  Resp: 16    Complications: No apparent anesthesia complications

## 2015-07-06 NOTE — Anesthesia Postprocedure Evaluation (Signed)
Anesthesia Post Note  Patient: Brittany [name]  Procedure(s) Performed: Procedure(s) (LRB): ROBOTIC ASSISTED TOTAL HYSTERECTOMY WITH SALPINGECTOMY/Ablation of Endometriosis (Bilateral)  Patient location during evaluation: PACU Anesthesia Type: General Level of consciousness: awake and alert Pain management: pain level controlled Vital Signs Assessment: post-procedure vital signs reviewed and stable Respiratory status: spontaneous breathing, nonlabored ventilation, respiratory function stable and patient connected to nasal cannula oxygen Cardiovascular status: blood pressure returned to baseline and stable Postop Assessment: no signs of nausea or vomiting Anesthetic complications: no Comments: Some nausea which is improved with reglan. Some anxiety which has improved with versed 0.5 mg IV and fentanyl 50 mcg IV    Last Vitals:  Filed Vitals:   07/06/15 1600 07/06/15 1615  BP: 125/67 113/66  Pulse: 69 63  Temp:    Resp: 13 11    Last Pain:  Filed Vitals:   07/06/15 1644  PainSc: 2                  Lucinda Spells J

## 2015-07-06 NOTE — Progress Notes (Signed)
Patient ID: Brittany [name], female   DOB: Aug 04, 1975, 40 y.o.   MRN: [id number] Patient seen and examined. Consent witnessed and signed. No changes noted. Update completed.

## 2015-07-06 NOTE — Op Note (Signed)
07/06/2015  2:59 PM  PATIENT:  Brittany [name]  40 y.o. female  PRE-OPERATIVE DIAGNOSIS:  Endometriosis  POST-OPERATIVE DIAGNOSIS:  Endometriosis  PROCEDURE:  Procedure(s): ROBOTIC ASSISTED TOTAL HYSTERECTOMY  BILATERAL SALPINGECTOMY EXCISION/Ablation of Endometriosis LYSIS OF PERI-OVARIAN AND BOWEL ADHESIONS MCCALL CUL DE PLASTY  SURGEON:  Surgeon(s): Brien Few, MD  ASSISTANTS: MODY, MD   ANESTHESIA:   local and general  ESTIMATED BLOOD LOSS: 100  DRAINS: Urinary Catheter (Foley)   LOCAL MEDICATIONS USED:  MARCAINE    and Amount: 10 ml  SPECIMEN:  Source of Specimen:  UTERUS, CERVIX AND BILATERAL TUBES  DISPOSITION OF SPECIMEN:  PATHOLOGY  COUNTS:  YES  DICTATION #: [id number]  PLAN OF CARE: DC HOME IN AM  PATIENT DISPOSITION:  STABLE

## 2015-07-06 NOTE — Anesthesia Procedure Notes (Signed)
Procedure Name: Intubation Date/Time: 07/06/2015 12:57 PM Performed by: Raenette Rover Pre-anesthesia Checklist: Patient identified, Emergency Drugs available, Suction available and Patient being monitored Patient Re-evaluated:Patient Re-evaluated prior to inductionOxygen Delivery Method: Circle system utilized Preoxygenation: Pre-oxygenation with 100% oxygen Intubation Type: IV induction Ventilation: Mask ventilation without difficulty Laryngoscope Size: Mac and 3 Grade View: Grade I Tube type: Oral Tube size: 7.0 mm Number of attempts: 1 Airway Equipment and Method: Stylet and Bite block Placement Confirmation: ETT inserted through vocal cords under direct vision,  breath sounds checked- equal and bilateral,  positive ETCO2 and CO2 detector Secured at: 21 cm Tube secured with: Tape Dental Injury: Teeth and Oropharynx as per pre-operative assessment

## 2015-07-07 ENCOUNTER — Encounter (HOSPITAL_COMMUNITY): Payer: Self-pay | Admitting: Obstetrics and Gynecology

## 2015-07-07 DIAGNOSIS — N803 Endometriosis of pelvic peritoneum: Secondary | ICD-10-CM | POA: Diagnosis not present

## 2015-07-07 LAB — BASIC METABOLIC PANEL
ANION GAP: 5 (ref 5–15)
BUN: 11 mg/dL (ref 6–20)
CALCIUM: 8.1 mg/dL — AB (ref 8.9–10.3)
CO2: 27 mmol/L (ref 22–32)
Chloride: 106 mmol/L (ref 101–111)
Creatinine, Ser: 0.63 mg/dL (ref 0.44–1.00)
GFR calc non Af Amer: >60 mL/min (ref >60)
Glucose, Bld: 141 mg/dL — ABNORMAL HIGH (ref 65–99)
Potassium: 4.1 mmol/L (ref 3.5–5.1)
SODIUM: 138 mmol/L (ref 135–145)

## 2015-07-07 LAB — CBC
HCT: 33.6 % — ABNORMAL LOW (ref 36.0–46.0)
HEMOGLOBIN: 11.4 g/dL — AB (ref 12.0–15.0)
MCH: 30.6 pg (ref 26.0–34.0)
MCHC: 33.9 g/dL (ref 30.0–36.0)
MCV: 90.3 fL (ref 78.0–100.0)
PLATELETS: 201 10*3/uL (ref 150–400)
RBC: 3.72 MIL/uL — AB (ref 3.87–5.11)
RDW: 12.7 % (ref 11.5–15.5)
WBC: 9.9 10*3/uL (ref 4.0–10.5)

## 2015-07-07 MED ORDER — TRAMADOL HCL 50 MG PO TABS: 50.0000 mg | ORAL_TABLET | Freq: Four times a day (QID) | ORAL | Status: DC | PRN

## 2015-07-07 MED ORDER — OXYCODONE-ACETAMINOPHEN 5-325 MG PO TABS: 1.0000 | ORAL_TABLET | ORAL | Status: DC | PRN

## 2015-07-07 NOTE — Progress Notes (Signed)
1 Day Post-Op Procedure(s) (LRB): ROBOTIC ASSISTED TOTAL HYSTERECTOMY WITH SALPINGECTOMY/Ablation of Endometriosis (Bilateral)  Subjective: Patient reports nausea, incisional pain, tolerating PO, + flatus and no problems voiding.    Objective: BP 95/58 mmHg  Pulse 62  Temp(Src) 98.2 F (36.8 C) (Oral)  Resp 15  Ht 5' 3"  (1.6 m)  Wt 61.236 kg (135 lb)  BMI 23.92 kg/m2  SpO2 99%  LMP 07/06/2015  CBC    Component Value Date/Time   WBC 9.9 07/07/2015 0550   RBC 3.72* 07/07/2015 0550   HGB 11.4* 07/07/2015 0550   HCT 33.6* 07/07/2015 0550   PLT 201 07/07/2015 0550   MCV 90.3 07/07/2015 0550   MCH 30.6 07/07/2015 0550   MCHC 33.9 07/07/2015 0550   RDW 12.7 07/07/2015 0550   LYMPHSABS 1.8 06/03/2010 1644   MONOABS 1.0 06/03/2010 1644   EOSABS 0.1 06/03/2010 1644   BASOSABS 0.0 06/03/2010 1644        I have reviewed patient's vital signs, intake and output, medications and labs.  General: alert, cooperative and appears stated age Resp: clear to auscultation bilaterally and normal percussion bilaterally Cardio: regular rate and rhythm, S1, S2 normal, no murmur, click, rub or gallop and regular rate and rhythm GI: soft, non-tender; bowel sounds normal; no masses,  no organomegaly and incision: clean, dry and intact Extremities: extremities normal, atraumatic, no cyanosis or edema and Homans sign is negative, no sign of DVT Vaginal Bleeding: minimal  Assessment: s/p Procedure(s): ROBOTIC ASSISTED TOTAL HYSTERECTOMY WITH SALPINGECTOMY/Ablation of Endometriosis (Bilateral): stable, progressing well and tolerating diet  Plan: Advance diet Encourage ambulation Advance to PO medication Discontinue IV fluids Discharge home     Braxley Balandran J 07/07/2015, 6:42 AM

## 2015-07-07 NOTE — Progress Notes (Signed)
Pt. Is discharged in the care of George R.N. Esc[facility]. Spirits are good.  Denies any pain or discomfort.Understands  All discharged instuctions well. Questions asked and answered. No equipment needed for home use. Abdominal incisions are clean and dry.

## 2015-07-07 NOTE — Op Note (Signed)
Brittany [name], Brittany [name] NO.:  [id number]  MEDICAL RECORD NO.:  [id number]  LOCATION:  9303                          FACILITY:  [facility]  PHYSICIAN:  Brittany [name], M.D.DATE OF BIRTH:  1975-06-11  DATE OF PROCEDURE: DATE OF DISCHARGE:                              OPERATIVE REPORT   PREOPERATIVE DIAGNOSES:  Severe dysmenorrhea, menorrhagia, symptomatic pelvic endometriosis.  POSTOPERATIVE DIAGNOSES:  Severe dysmenorrhea, menorrhagia, symptomatic pelvic endometriosis plus periovarian and left sigmoid adhesions, peritoneal endometriosis.  PROCEDURE:  da-Vinci assisted total laparoscopic hysterectomy, bilateral salpingectomy, excision and ablation of endometriosis, lysis of bowel and periovarian adhesions, McCall culdoplasty.  SURGEON:  Brittany Kim, MD.  Terrence DupontLisbeth Renshaw.  ANESTHESIA:  General and local.  ESTIMATED BLOOD LOSS:  100 mL.  COMPLICATIONS:  None.  DRAINS:  Foley.  COUNTS:  Correct.  DISPOSITION:  The patient to recovery in good condition.  BRIEF OPERATIVE NOTE:  After being apprised of risks of anesthesia, infection, bleeding, injury to surrounding organs, possible need for repair, delayed versus immediate complications to include bowel and bladder injury, possible need for repair, the patient was brought to the operating room where she was administered with general anesthetic without complications, prepped and draped in usual sterile fashion. Foley catheter placed.  Exam under anesthesia revealed a retroflexed uterus, which is mobile.  No adnexal masses appreciated.  At this time, RUMI retractor and Foley catheter placed.  An umbilical incision made with a scalpel.  Veress needle placed with opening pressure of -2, 4 L CO2 insufflated without difficulty.  Atraumatic trocar placement. Visualization reveals pelvic endometriosis along the bilateral ovarian fossa, endometriosis of the uterus attaching the ovaries to the back wall of the  uterus and vesicular implants were noted.  Bilateral normal- appearing tubes, normal anterior cul-de-sac at this time.  One robotic port was placed on the right, one robotic on the left, and a 5-mm port on the left.  AirSeal was initiated.  Robot was docked in a standard fashion.  PK forceps and Endoshears were placed, deep Trendelenburg position, having been established.  At this time, the retroperitoneal space was entered on the left.  After lysis of sigmoid bowel adhesions on the left, the ureters identified, peristalsing up the medial leaf of the peritoneum.  The mesosalpinx was undermined and divided, there adhesions and endometriosis adhesing the left ovary to the back wall of the uterus which was sharply and bluntly dissected freeing the left ovary from its attachments and then the ovarian ligament was divided. The retroperitoneal space was further divided as the posterior broad ligament leaf is developed.  The left round ligament was cauterized and cut.  Bladder flap was sharply developed and the left uterine vessels were skeletonized, cauterized, but not cut.  On the right side, there was evidence of a fibroid infiltrating the broad ligament underneath the round ligament.  The retroperitoneal space was entered.  The mesosalpinx was undermined and divided.  The ovary was lysed sharply off the back wall of the uterus, underwent ablation and excision of endometriosis was performed taking the endometriosis with the uterine specimen.  The ureter was seen peristalsing normally off the medial leaf of the peritoneum.  Retroperitoneal space was further developed so that the fibroid can be mobilized to the mid position and then the right uterine vessels were skeletonized.  Bladder flap was sharply developed.  At this time, the uterine vessels on the right were cauterized and cut and cut on the left as well.  The specimen was removed circumferentially using monopolar cautery and retracted  into the vagina.  The vagina was closed using a 0 V-Loc suture stitch in running fashion and a 2nd V-Loc suture was placed for McCall culdoplasty stitch.  Two imbricating layers of vagina were performed and vaginal exam reveals the vaginal vault to be well approximated.  At this time, good hemostasis was noted.  Irrigation was accomplished.  120 mL of ropivacaine were placed into the pelvis. All instruments were removed under direct visualization.  CO2 released. Incisional closure with 0 Vicryl, 4-0 Vicryl, and Dermabond.  The patient tolerated the procedure well, was awakened and transferred to recovery in good condition after noting clear urine.     Brittany [name], M.D.     RJT/MEDQ  D:  07/06/2015  T:  07/07/2015  Job:  [id number]

## 2015-07-09 ENCOUNTER — Encounter (HOSPITAL_COMMUNITY): Payer: Self-pay

## 2015-07-09 ENCOUNTER — Emergency Department (HOSPITAL_COMMUNITY): Payer: Managed Care, Other (non HMO)

## 2015-07-09 ENCOUNTER — Emergency Department (HOSPITAL_COMMUNITY)
Admission: EM | Admit: 2015-07-09 | Discharge: 2015-07-10 | Disposition: A | Discharge status: Home / Self Care | Payer: Managed Care, Other (non HMO) | Attending: Emergency Medicine | Admitting: Emergency Medicine

## 2015-07-09 DIAGNOSIS — R1084 Generalized abdominal pain: Secondary | ICD-10-CM | POA: Diagnosis not present

## 2015-07-09 DIAGNOSIS — G8918 Other acute postprocedural pain: Secondary | ICD-10-CM | POA: Diagnosis not present

## 2015-07-09 DIAGNOSIS — Z90710 Acquired absence of both cervix and uterus: Secondary | ICD-10-CM | POA: Insufficient documentation

## 2015-07-09 DIAGNOSIS — R002 Palpitations: Secondary | ICD-10-CM | POA: Diagnosis not present

## 2015-07-09 DIAGNOSIS — R079 Chest pain, unspecified: Secondary | ICD-10-CM | POA: Diagnosis present

## 2015-07-09 DIAGNOSIS — Z85828 Personal history of other malignant neoplasm of skin: Secondary | ICD-10-CM | POA: Diagnosis not present

## 2015-07-09 DIAGNOSIS — R0602 Shortness of breath: Secondary | ICD-10-CM | POA: Diagnosis not present

## 2015-07-09 DIAGNOSIS — Z79899 Other long term (current) drug therapy: Secondary | ICD-10-CM | POA: Diagnosis not present

## 2015-07-09 DIAGNOSIS — I10 Essential (primary) hypertension: Secondary | ICD-10-CM | POA: Diagnosis not present

## 2015-07-09 LAB — CBC
HEMATOCRIT: 41.4 % (ref 36.0–46.0)
HEMOGLOBIN: 13.9 g/dL (ref 12.0–15.0)
MCH: 30 pg (ref 26.0–34.0)
MCHC: 33.6 g/dL (ref 30.0–36.0)
MCV: 89.2 fL (ref 78.0–100.0)
Platelets: 261 10*3/uL (ref 150–400)
RBC: 4.64 MIL/uL (ref 3.87–5.11)
RDW: 12.2 % (ref 11.5–15.5)
WBC: 5.7 10*3/uL (ref 4.0–10.5)

## 2015-07-09 LAB — BASIC METABOLIC PANEL
ANION GAP: 11 (ref 5–15)
BUN: 6 mg/dL (ref 6–20)
CALCIUM: 9.5 mg/dL (ref 8.9–10.3)
CO2: 29 mmol/L (ref 22–32)
Chloride: 102 mmol/L (ref 101–111)
Creatinine, Ser: 0.69 mg/dL (ref 0.44–1.00)
GLUCOSE: 103 mg/dL — AB (ref 65–99)
POTASSIUM: 4.1 mmol/L (ref 3.5–5.1)
Sodium: 142 mmol/L (ref 135–145)

## 2015-07-09 LAB — I-STAT TROPONIN, ED: TROPONIN I, POC: 0 ng/mL (ref 0.00–0.08)

## 2015-07-09 NOTE — ED Notes (Signed)
Pt here with c/o sharp, central chest pain that radiates down her right arm,onset this afternoon. Pain associated with SOB and palpitations. She had hysterectomy on Monday.

## 2015-07-09 NOTE — ED Provider Notes (Signed)
CSN: [id number]     Arrival date & time 07/09/15  1901 History  By signing my name below, I, Arianna Nassar, attest that this documentation has been prepared under the direction and in the presence of Everlene Balls, MD. Electronically Signed: Julien Nordmann, ED Scribe. 07/09/2015. 12:44 AM.    Chief Complaint  Patient presents with  . Chest Pain      The history is provided by the patient. No language interpreter was used.   HPI Comments: Brittany [name] is a 40 y.o. female who has a hx of HTN, PONV, and skin cancer presents to the Emergency Department complaining of sudden onset, gradual worsening, central chest pain that radiates down her right arm onset this afternoon. She has been having associated shortness of breath and palpitations. Pt says her pain is worsened with a deep breath. Pt notes having a laparoscopic hysterectomy on Monday. Pt says she has not had a bowel movement since and has hardly passed gas. She was prescribed percocet with no relief.   Past Medical History  Diagnosis Date  . Hypertension   . PONV (postoperative nausea and vomiting)   . Headache   . Cancer ([place])     skin cancer stage 1 100% removed   Past Surgical History  Procedure Laterality Date  . Laparoscopy    . Robotic assisted laparoscopic lysis of adhesion N/A 01/03/2013    Procedure: ROBOTIC ASSISTED LAPAROSCOPIC LYSIS OF ADHESION; Ablation of Endometriosis;  Surgeon: Lovenia Kim, MD;  Location: [facility];  Service: Gynecology;  Laterality: N/A;  . Robotic assisted total hysterectomy with salpingectomy Bilateral 07/06/2015    Procedure: ROBOTIC ASSISTED TOTAL HYSTERECTOMY WITH SALPINGECTOMY/Ablation of Endometriosis;  Surgeon: Brien Few, MD;  Location: [facility];  Service: Gynecology;  Laterality: Bilateral;   No family history on file. Social History  Substance Use Topics  . Smoking status: Never Smoker   . Smokeless tobacco: Never Used  . Alcohol Use: 0.6 oz/week    1 Glasses of wine per week      Comment: occasionally   OB History    No data available     Review of Systems  A complete 10 system review of systems was obtained and all systems are negative except as noted in the HPI and PMH.    Allergies  Erythromycin; Macrobid; and Other  Home Medications   Prior to Admission medications   Medication Sig Start Date End Date Taking? Authorizing Provider  aspirin-acetaminophen-caffeine (EXCEDRIN MIGRAINE) [phone] MG tablet Take 2 tablets by mouth every 6 (six) hours as needed for headache or migraine.    Historical Provider, MD  labetalol (NORMODYNE) 100 MG tablet Take 100 mg by mouth 2 (two) times daily.    Historical Provider, MD  oxyCODONE-acetaminophen (PERCOCET/ROXICET) 5-325 MG tablet Take 1 tablet by mouth every 8 (eight) hours as needed for moderate pain.  06/11/15   Historical Provider, MD  oxyCODONE-acetaminophen (PERCOCET/ROXICET) 5-325 MG tablet Take 1-2 tablets by mouth every 3 (three) hours as needed for moderate pain. 07/07/15   Brien Few, MD  Prenatal Vit-Fe Fumarate-FA (PRENATAL MULTIVITAMIN) TABS tablet Take 1 tablet by mouth daily at 12 noon.    Historical Provider, MD  traMADol (ULTRAM) 50 MG tablet Take 1-2 tablets (50-100 mg total) by mouth every 6 (six) hours as needed for moderate pain. 07/07/15   Brien Few, MD   Triage vitals: BP 146/94 mmHg  Pulse 82  Temp(Src) 99.2 F (37.3 C) (Oral)  Resp 16  SpO2 99%  LMP 06/10/2015 (Exact  Date) Physical Exam  Constitutional: She is oriented to person, place, and time. She appears well-developed and well-nourished. No distress.  HENT:  Head: Normocephalic and atraumatic.  Nose: Nose normal.  Mouth/Throat: Oropharynx is clear and moist. No oropharyngeal exudate.  Eyes: Conjunctivae and EOM are normal. Pupils are equal, round, and reactive to light. No scleral icterus.  Neck: Normal range of motion. Neck supple. No JVD present. No tracheal deviation present. No thyromegaly present.  Cardiovascular:  Normal rate, regular rhythm and normal heart sounds.  Exam reveals no gallop and no friction rub.   No murmur heard. Pulmonary/Chest: Effort normal and breath sounds normal. No respiratory distress. She has no wheezes. She exhibits no tenderness.  Abdominal: Soft. Bowel sounds are normal. She exhibits no distension and no mass. There is tenderness. There is no rebound and no guarding.  Diffuse TTP of the abdomen, 3, 1.5cm surgical sites well healed, sealed with dermabond, no signs of infection  Musculoskeletal: Normal range of motion. She exhibits no edema or tenderness.  Lymphadenopathy:    She has no cervical adenopathy.  Neurological: She is alert and oriented to person, place, and time. No cranial nerve deficit. She exhibits normal muscle tone.  Skin: Skin is warm and dry. No rash noted. No erythema. No pallor.  Nursing note and vitals reviewed.   ED Course  Procedures  DIAGNOSTIC STUDIES: Oxygen Saturation is 99% on RA, normal by my interpretation.  COORDINATION OF CARE:  12:22 AM Will order CT chest, abdomen, and pelvis. Discussed treatment plan with pt at bedside and pt agreed to plan.  Labs Review Labs Reviewed  BASIC METABOLIC PANEL - Abnormal; Notable for the following:    Glucose, Bld 103 (*)    All other components within normal limits  CBC  I-STAT TROPOININ, ED    Imaging Review Dg Chest 2 View  07/09/2015  CLINICAL DATA:  Shortness of breath and chest pain. Hysterectomy 3 days prior EXAM: CHEST  2 VIEW COMPARISON:  None. FINDINGS: Pneumoperitoneum may well be secondary to recent surgery. There is no edema or consolidation. Heart size and pulmonary vascularity are normal. No adenopathy. There is lower thoracic levoscoliosis. IMPRESSION: There is pneumoperitoneum. This pneumoperitoneum may well be postoperative in etiology. The patient should be watched closely clinically given this finding; perforated abdominal viscus cannot be excluded in this circumstance. No edema or  consolidation. Electronically Signed   By: Lowella Grip III M.D.   On: 07/09/2015 20:13   Ct Angio Chest Pe W/cm &/or Wo Cm  07/10/2015  CLINICAL DATA:  Status post recent hysterectomy. Evaluate for free intra-abdominal air or small bowel obstruction. Sharp central chest pain radiating down the right arm. Assess for pulmonary embolus. Initial encounter. EXAM: CT ANGIOGRAPHY CHEST CT ABDOMEN AND PELVIS WITH CONTRAST TECHNIQUE: Multidetector CT imaging of the chest was performed using the standard protocol during bolus administration of intravenous contrast. Multiplanar CT image reconstructions and MIPs were obtained to evaluate the vascular anatomy. Multidetector CT imaging of the abdomen and pelvis was performed using the standard protocol during bolus administration of intravenous contrast. CONTRAST:  100 mL of Isovue 370 IV contrast COMPARISON:  CT of the abdomen and pelvis from 12/30/2013, and chest radiograph performed 07/09/2015 FINDINGS: CTA CHEST FINDINGS There is no evidence of pulmonary embolus. Mild bibasilar atelectasis is noted. There is no evidence of significant focal consolidation, pleural effusion or pneumothorax. No masses are identified; no abnormal focal contrast enhancement is seen. The mediastinum is unremarkable in appearance. No mediastinal lymphadenopathy is  seen. No pericardial effusion is identified. The great vessels are grossly unremarkable in appearance. No axillary lymphadenopathy is seen. The thyroid gland is unremarkable in appearance. No acute osseous abnormalities are seen. CT ABDOMEN and PELVIS FINDINGS There is an unusually prominent amount of air along the anterior abdominal wall, and a small to moderate amount of free air within the abdomen and pelvis. This may still be within normal limits given recent hysterectomy. However, a small to moderate amount of mildly complex fluid is noted within the pelvis, and bowel perforation cannot be excluded. Would correlate with the  patient's symptoms. The liver and spleen are unremarkable in appearance. The gallbladder is within normal limits. The pancreas and adrenal glands are unremarkable. The kidneys are unremarkable in appearance. There is no evidence of hydronephrosis. No renal or ureteral stones are seen. No perinephric stranding is appreciated. The pattern of air at the mid abdomen could reflect the focus of perforation, at the mid ileum. There is no evidence of bowel dilatation to suggest obstruction. The stomach is within normal limits. No acute vascular abnormalities are seen. The appendix is not well characterized; there is no evidence of appendicitis. The colon is unremarkable in appearance. The bladder is mildly distended and grossly unremarkable. The patient is status post hysterectomy. No suspicious adnexal masses are seen. No inguinal lymphadenopathy is seen. No acute osseous abnormalities are identified. Review of the MIP images confirms the above findings. IMPRESSION: 1. No evidence of pulmonary embolus. 2. Unusually prominent amount of air along the anterior abdominal wall, and small to moderate amount of free air within the abdomen and pelvis. This may still be within normal limits 3 days status post hysterectomy. However, a small to moderate amount of mildly complex fluid is seen within the pelvis, and bowel perforation cannot be excluded, possibly about the mid ileum. Would correlate with the patient's symptoms. 3. Mild bibasilar atelectasis noted. These results were called by telephone at the time of interpretation on 07/10/2015 at 3:44 am to Dr. Everlene Balls, who verbally acknowledged these results. Electronically Signed   By: Garald Balding M.D.   On: 07/10/2015 03:45   Ct Abdomen Pelvis W Contrast  07/10/2015  CLINICAL DATA:  Status post recent hysterectomy. Evaluate for free intra-abdominal air or small bowel obstruction. Sharp central chest pain radiating down the right arm. Assess for pulmonary embolus. Initial  encounter. EXAM: CT ANGIOGRAPHY CHEST CT ABDOMEN AND PELVIS WITH CONTRAST TECHNIQUE: Multidetector CT imaging of the chest was performed using the standard protocol during bolus administration of intravenous contrast. Multiplanar CT image reconstructions and MIPs were obtained to evaluate the vascular anatomy. Multidetector CT imaging of the abdomen and pelvis was performed using the standard protocol during bolus administration of intravenous contrast. CONTRAST:  100 mL of Isovue 370 IV contrast COMPARISON:  CT of the abdomen and pelvis from 12/30/2013, and chest radiograph performed 07/09/2015 FINDINGS: CTA CHEST FINDINGS There is no evidence of pulmonary embolus. Mild bibasilar atelectasis is noted. There is no evidence of significant focal consolidation, pleural effusion or pneumothorax. No masses are identified; no abnormal focal contrast enhancement is seen. The mediastinum is unremarkable in appearance. No mediastinal lymphadenopathy is seen. No pericardial effusion is identified. The great vessels are grossly unremarkable in appearance. No axillary lymphadenopathy is seen. The thyroid gland is unremarkable in appearance. No acute osseous abnormalities are seen. CT ABDOMEN and PELVIS FINDINGS There is an unusually prominent amount of air along the anterior abdominal wall, and a small to moderate amount of free air  within the abdomen and pelvis. This may still be within normal limits given recent hysterectomy. However, a small to moderate amount of mildly complex fluid is noted within the pelvis, and bowel perforation cannot be excluded. Would correlate with the patient's symptoms. The liver and spleen are unremarkable in appearance. The gallbladder is within normal limits. The pancreas and adrenal glands are unremarkable. The kidneys are unremarkable in appearance. There is no evidence of hydronephrosis. No renal or ureteral stones are seen. No perinephric stranding is appreciated. The pattern of air at the  mid abdomen could reflect the focus of perforation, at the mid ileum. There is no evidence of bowel dilatation to suggest obstruction. The stomach is within normal limits. No acute vascular abnormalities are seen. The appendix is not well characterized; there is no evidence of appendicitis. The colon is unremarkable in appearance. The bladder is mildly distended and grossly unremarkable. The patient is status post hysterectomy. No suspicious adnexal masses are seen. No inguinal lymphadenopathy is seen. No acute osseous abnormalities are identified. Review of the MIP images confirms the above findings. IMPRESSION: 1. No evidence of pulmonary embolus. 2. Unusually prominent amount of air along the anterior abdominal wall, and small to moderate amount of free air within the abdomen and pelvis. This may still be within normal limits 3 days status post hysterectomy. However, a small to moderate amount of mildly complex fluid is seen within the pelvis, and bowel perforation cannot be excluded, possibly about the mid ileum. Would correlate with the patient's symptoms. 3. Mild bibasilar atelectasis noted. These results were called by telephone at the time of interpretation on 07/10/2015 at 3:44 am to Dr. Everlene Balls, who verbally acknowledged these results. Electronically Signed   By: Garald Balding M.D.   On: 07/10/2015 03:45   I have personally reviewed and evaluated these images and lab results as part of my medical decision-making.   EKG Interpretation   Date/Time:  Thursday July 09 2015 19:06:49 EDT Ventricular Rate:  77 PR Interval:  138 QRS Duration: 84 QT Interval:  378 QTC Calculation: 427 R Axis:   61 Text Interpretation:  Normal sinus rhythm Normal ECG No significant change  since last tracing Confirmed by Glynn Octave [phone]) on 07/09/2015  11:59:06 PM      MDM   Final diagnoses:  None   Patient presents to the ED for chest pain after recent hysterectomy.  There is free air on  chest xray, which may be normal after post op.  She also has not had a BM.  Will obtain CTA chest for PE and CT abd/pelvis for further evaluation of free air and SBO.  She took percocet for pain control.  4:58 AM CT reveals no PE, large amounts of free air in the abdomen with nonspecific fluid. I discussed the case with Dr. Ronita Hipps who and he states it was a completely uncomplicated surgery.  She has had pain control issues in the past and he recs for further pain control with close follow up in clinic tomorrow.  Upon repeat evaluation of the patient, her pain has improved and abdominal exam is no longer peritoneal. She has percocet to take at home.  She was advised to call his clinic tomorrow for an appointment.  She demonstrates good understanding. She appears well and in NAD.  VS remain within her normal limits and she is safe for DC.    I personally performed the services described in this documentation, which was scribed in my presence. The  recorded information has been reviewed and is accurate.     Everlene Balls, MD 07/10/15 0500

## 2015-07-10 ENCOUNTER — Emergency Department (HOSPITAL_COMMUNITY): Payer: Managed Care, Other (non HMO)

## 2015-07-10 MED ORDER — IOPAMIDOL (ISOVUE-370) INJECTION 76%
INTRAVENOUS | Status: AC
  Administered 2015-07-10: 100 mL
  Filled 2015-07-10: qty 100

## 2015-07-10 MED ORDER — SODIUM CHLORIDE 0.9 % IV BOLUS (SEPSIS)
1000.0000 mL | Freq: Once | INTRAVENOUS | Status: AC
  Administered 2015-07-10: 1000 mL via INTRAVENOUS

## 2015-07-10 NOTE — Discharge Instructions (Signed)
Hysterectomy Information Ms. Hippler, your CT scan shows some free air and fluid in your abdomen which can be normal after surgery.  Dr. Ronita Hipps wants you to call him tomorrow to get an appointment in clinic.  Take pain medication as prescribed.  If any symptoms worsen, come back to the ED immediately. Thank you.  A hysterectomy is a surgery in which your uterus is removed. This surgery may be done to treat various medical problems. After the surgery, you will no longer have menstrual periods. The surgery will also make you unable to become pregnant (sterile). The fallopian tubes and ovaries can be removed (bilateral salpingo-oophorectomy) during this surgery as well.  REASONS FOR A HYSTERECTOMY  Persistent, abnormal bleeding.  Lasting (chronic) pelvic pain or infection.  The lining of the uterus (endometrium) starts growing outside the uterus (endometriosis).  The endometrium starts growing in the muscle of the uterus (adenomyosis).  The uterus falls down into the vagina (pelvic organ prolapse).  Noncancerous growths in the uterus (uterine fibroids) that cause symptoms.  Precancerous cells.  Cervical cancer or uterine cancer. TYPES OF HYSTERECTOMIES  Supracervical hysterectomy--In this type, the top part of the uterus is removed, but not the cervix.  Total hysterectomy--The uterus and cervix are removed.  Radical hysterectomy--The uterus, the cervix, and the fibrous tissue that holds the uterus in place in the pelvis (parametrium) are removed. WAYS A HYSTERECTOMY CAN BE PERFORMED  Abdominal hysterectomy--A large surgical cut (incision) is made in the abdomen. The uterus is removed through this incision.  Vaginal hysterectomy--An incision is made in the vagina. The uterus is removed through this incision. There are no abdominal incisions.  Conventional laparoscopic hysterectomy--Three or four small incisions are made in the abdomen. A thin, lighted tube with a camera (laparoscope) is  inserted into one of the incisions. Other tools are put through the other incisions. The uterus is cut into small pieces. The small pieces are removed through the incisions, or they are removed through the vagina.  Laparoscopically assisted vaginal hysterectomy (LAVH)--Three or four small incisions are made in the abdomen. Part of the surgery is performed laparoscopically and part vaginally. The uterus is removed through the vagina.  Robot-assisted laparoscopic hysterectomy--A laparoscope and other tools are inserted into 3 or 4 small incisions in the abdomen. A computer-controlled device is used to give the surgeon a 3D image and to help control the surgical instruments. This allows for more precise movements of surgical instruments. The uterus is cut into small pieces and removed through the incisions or removed through the vagina. RISKS AND COMPLICATIONS  Possible complications associated with this procedure include:  Bleeding and risk of blood transfusion. Tell your health care provider if you do not want to receive any blood products.  Blood clots in the legs or lung.  Infection.  Injury to surrounding organs.  Problems or side effects related to anesthesia.  Conversion to an abdominal hysterectomy from one of the other techniques. WHAT TO EXPECT AFTER A HYSTERECTOMY  You will be given pain medicine.  You will need to have someone with you for the first 3-5 days after you go home.  You will need to follow up with your surgeon in 2-4 weeks after surgery to evaluate your progress.  You may have early menopause symptoms such as hot flashes, night sweats, and insomnia.  If you had a hysterectomy for a problem that was not cancer or not a condition that could lead to cancer, then you no longer need Pap tests. However,  even if you no longer need a Pap test, a regular exam is a good idea to make sure no other problems are starting.   This information is not intended to replace advice  given to you by your health care provider. Make sure you discuss any questions you have with your health care provider.   Document Released: 09/07/2000 Document Revised: 01/02/2013 Document Reviewed: 11/19/2012 Elsevier Interactive Patient Education [employer].

## 2015-07-10 NOTE — ED Notes (Signed)
Taken to CT at this time. 

## 2015-07-28 ENCOUNTER — Ambulatory Visit (INDEPENDENT_AMBULATORY_CARE_PROVIDER_SITE_OTHER): Payer: Managed Care, Other (non HMO) | Admitting: Podiatry

## 2015-07-28 ENCOUNTER — Encounter: Payer: Self-pay | Admitting: Podiatry

## 2015-07-28 VITALS — BP 147/66 | HR 86 | Resp 12

## 2015-07-28 DIAGNOSIS — L600 Ingrowing nail: Secondary | ICD-10-CM | POA: Diagnosis not present

## 2015-07-28 MED ORDER — OXYCODONE-ACETAMINOPHEN 10-325 MG PO TABS: 1.0000 | ORAL_TABLET | Freq: Three times a day (TID) | ORAL | Status: DC | PRN

## 2015-07-28 NOTE — Patient Instructions (Signed)

## 2015-07-28 NOTE — Progress Notes (Signed)
She presents today with a chief complaint of a painful hallux nail plate right with a painful bleeding lesion at the distal aspect of the toe. She states that it is very painful and at this nail grows out is becoming more problematic.  Objective: Vital signs are stable she is alert and oriented 3. The nail appears to be creating a small granuloma to the distal aspect of the nail hallux right and there appears to be a small 1 growing on the left tip of the toe as well.  Assessment: Granuloma right hallux.  Plan: At this point we performed a excision of granuloma and will send this for pathologic evaluation. This was performed under local anesthetic she tolerated procedure well there was slightly uncomfortable with the application of phenol. She was given both oral and written home going instructions for care and soaking of her toe as well as prescription for pain medication. I will follow-up with her in 1 week.

## 2015-07-30 ENCOUNTER — Telehealth: Payer: Self-pay | Admitting: *Deleted

## 2015-07-30 NOTE — Telephone Encounter (Signed)
Pt states she was seen for a surgical procedure 07/28/2015 and given post care instructions to soak in Betadine 2 times daily, but for how long?I spoke with pt and informed to soak 2 time daily for 20 minuites each time and until the area got a dry hard scab without redness, drainage or swelling.  Pt states understanding.

## 2015-08-06 ENCOUNTER — Ambulatory Visit: Payer: Managed Care, Other (non HMO) | Admitting: Podiatry

## 2015-08-07 ENCOUNTER — Ambulatory Visit: Payer: Managed Care, Other (non HMO) | Admitting: Podiatry

## 2015-08-11 ENCOUNTER — Ambulatory Visit (INDEPENDENT_AMBULATORY_CARE_PROVIDER_SITE_OTHER): Payer: Managed Care, Other (non HMO) | Admitting: Podiatry

## 2015-08-11 ENCOUNTER — Ambulatory Visit: Payer: Managed Care, Other (non HMO) | Admitting: Podiatry

## 2015-08-11 ENCOUNTER — Encounter: Payer: Self-pay | Admitting: Podiatry

## 2015-08-11 DIAGNOSIS — L600 Ingrowing nail: Secondary | ICD-10-CM

## 2015-08-11 NOTE — Progress Notes (Signed)
She presents today for follow-up of excision granuloma distal aspect hallux right. She is very happy with the outcome. She states that we get a very good job. She's been soaking and has not done so for the past week or so.  Objective: Vital signs are stable she's alert and oriented 3 no erythema edema saline as drainage or odor nail appears to be healing normally as does the distal aspect of the toe.  Assessment: Well-healing surgical site distal aspect hallux right.  Plan: Follow up with [place] on an as-needed basis.

## 2015-09-08 ENCOUNTER — Encounter: Payer: Self-pay | Admitting: Podiatry

## 2018-10-06 ENCOUNTER — Encounter (HOSPITAL_COMMUNITY): Payer: Self-pay | Admitting: Emergency Medicine

## 2018-10-06 ENCOUNTER — Emergency Department (HOSPITAL_COMMUNITY): Payer: Managed Care, Other (non HMO)

## 2018-10-06 ENCOUNTER — Emergency Department (HOSPITAL_COMMUNITY)
Admission: EM | Admit: 2018-10-06 | Discharge: 2018-10-06 | Disposition: A | Discharge status: Home / Self Care | Payer: Managed Care, Other (non HMO) | Attending: Emergency Medicine | Admitting: Emergency Medicine

## 2018-10-06 ENCOUNTER — Other Ambulatory Visit: Payer: Self-pay

## 2018-10-06 DIAGNOSIS — N83201 Unspecified ovarian cyst, right side: Secondary | ICD-10-CM | POA: Diagnosis not present

## 2018-10-06 DIAGNOSIS — I10 Essential (primary) hypertension: Secondary | ICD-10-CM | POA: Diagnosis not present

## 2018-10-06 DIAGNOSIS — Z79899 Other long term (current) drug therapy: Secondary | ICD-10-CM | POA: Diagnosis not present

## 2018-10-06 DIAGNOSIS — R1031 Right lower quadrant pain: Secondary | ICD-10-CM

## 2018-10-06 DIAGNOSIS — Z85828 Personal history of other malignant neoplasm of skin: Secondary | ICD-10-CM | POA: Diagnosis not present

## 2018-10-06 DIAGNOSIS — R109 Unspecified abdominal pain: Secondary | ICD-10-CM | POA: Diagnosis present

## 2018-10-06 LAB — URINALYSIS, ROUTINE W REFLEX MICROSCOPIC
Bacteria, UA: NONE SEEN
Bilirubin Urine: NEGATIVE
Glucose, UA: NEGATIVE mg/dL
Ketones, ur: NEGATIVE mg/dL
Leukocytes,Ua: NEGATIVE
Nitrite: NEGATIVE
Protein, ur: NEGATIVE mg/dL
Specific Gravity, Urine: 1.009 (ref 1.005–1.030)
pH: 6 (ref 5.0–8.0)

## 2018-10-06 LAB — CBC
HCT: 42.3 % (ref 36.0–46.0)
Hemoglobin: 14.4 g/dL (ref 12.0–15.0)
MCH: 31 pg (ref 26.0–34.0)
MCHC: 34 g/dL (ref 30.0–36.0)
MCV: 91.2 fL (ref 80.0–100.0)
Platelets: 271 10*3/uL (ref 150–400)
RBC: 4.64 MIL/uL (ref 3.87–5.11)
RDW: 12.6 % (ref 11.5–15.5)
WBC: 7.4 10*3/uL (ref 4.0–10.5)
nRBC: 0 % (ref 0.0–0.2)

## 2018-10-06 LAB — COMPREHENSIVE METABOLIC PANEL
ALT: 15 U/L (ref 0–44)
AST: 20 U/L (ref 15–41)
Albumin: 4.1 g/dL (ref 3.5–5.0)
Alkaline Phosphatase: 42 U/L (ref 38–126)
Anion gap: 9 (ref 5–15)
BUN: 13 mg/dL (ref 6–20)
CO2: 24 mmol/L (ref 22–32)
Calcium: 9.3 mg/dL (ref 8.9–10.3)
Chloride: 104 mmol/L (ref 98–111)
Creatinine, Ser: 0.75 mg/dL (ref 0.44–1.00)
GFR calc Af Amer: >60 mL/min (ref >60)
GFR calc non Af Amer: >60 mL/min (ref >60)
Glucose, Bld: 96 mg/dL (ref 70–99)
Potassium: 4.4 mmol/L (ref 3.5–5.1)
Sodium: 137 mmol/L (ref 135–145)
Total Bilirubin: 0.8 mg/dL (ref 0.3–1.2)
Total Protein: 7.3 g/dL (ref 6.5–8.1)

## 2018-10-06 LAB — LIPASE, BLOOD: Lipase: 41 U/L (ref 11–51)

## 2018-10-06 LAB — I-STAT BETA HCG BLOOD, ED (MC, WL, AP ONLY): I-stat hCG, quantitative: <5 m[IU]/mL (ref <5)

## 2018-10-06 MED ORDER — SODIUM CHLORIDE 0.9 % IV BOLUS
1000.0000 mL | Freq: Once | INTRAVENOUS | Status: AC
  Administered 2018-10-06: 1000 mL via INTRAVENOUS

## 2018-10-06 MED ORDER — OXYCODONE-ACETAMINOPHEN 5-325 MG PO TABS: 2.0000 | ORAL_TABLET | Freq: Four times a day (QID) | ORAL | 0 refills | Status: DC | PRN

## 2018-10-06 MED ORDER — MORPHINE SULFATE (PF) 4 MG/ML IV SOLN
4.0000 mg | Freq: Once | INTRAVENOUS | Status: AC
  Administered 2018-10-06: 4 mg via INTRAVENOUS
  Filled 2018-10-06: qty 1

## 2018-10-06 MED ORDER — IOHEXOL 300 MG/ML  SOLN
100.0000 mL | Freq: Once | INTRAMUSCULAR | Status: AC | PRN
  Administered 2018-10-06: 100 mL via INTRAVENOUS

## 2018-10-06 MED ORDER — SODIUM CHLORIDE 0.9% FLUSH
3.0000 mL | Freq: Once | INTRAVENOUS | Status: AC
  Administered 2018-10-06: 3 mL via INTRAVENOUS

## 2018-10-06 NOTE — ED Provider Notes (Signed)
Brittany [name] Provider Note   CSN: [id number] Arrival date & time: 10/06/18  1529    History   Chief Complaint Chief Complaint  Patient presents with   Flank Pain   Abdominal Pain    HPI Brittany [name] is a 43 y.o. female with PMHx HTN who presents to the ED today complaining of gradual onset, constant, worsening, sharp, right flank pain radiating into RLQ that began this morning around 8:30 AM. Pt also endorses worsening pain in her right flank with urination but denies dysuria (triage note states differently). Pt states she had a looser stool this AM without blood or melena. Denies fever, chills, nausea, vomiting, dysuria, frequency, pelvic pain, vaginal discharge, vaginal itching. No suspicious food intake. No recent abx. No heavy EtOH use.        Past Medical History:  Diagnosis Date   Cancer ([place])    skin cancer stage 1 100% removed   Headache    Hypertension    PONV (postoperative nausea and vomiting)     Patient Active Problem List   Diagnosis Date Noted   Dysmenorrhea 07/06/2015    Past Surgical History:  Procedure Laterality Date   LAPAROSCOPY     ROBOTIC ASSISTED LAPAROSCOPIC LYSIS OF ADHESION N/A 01/03/2013   Procedure: ROBOTIC ASSISTED LAPAROSCOPIC LYSIS OF ADHESION; Ablation of Endometriosis;  Surgeon: Lovenia Kim, MD;  Location: [facility];  Service: Gynecology;  Laterality: N/A;   ROBOTIC ASSISTED TOTAL HYSTERECTOMY WITH SALPINGECTOMY Bilateral 07/06/2015   Procedure: ROBOTIC ASSISTED TOTAL HYSTERECTOMY WITH SALPINGECTOMY/Ablation of Endometriosis;  Surgeon: Brien Few, MD;  Location: [facility];  Service: Gynecology;  Laterality: Bilateral;     OB History   No obstetric history on file.      Home Medications    Prior to Admission medications   Medication Sig Start Date End Date Taking? Authorizing Provider  aspirin-acetaminophen-caffeine (EXCEDRIN MIGRAINE) [phone] MG tablet Take 2 tablets by mouth  every 6 (six) hours as needed for headache or migraine.   Yes [provider]  ELDERBERRY PO Take 10 mLs by mouth daily.   Yes [provider]  ibuprofen (ADVIL) 200 MG tablet Take 400 mg by mouth every 6 (six) hours as needed for headache (pain).   Yes [provider]  lisinopril (ZESTRIL) 10 MG tablet Take 10 mg by mouth daily.   Yes [provider]  Magnesium 500 MG TABS Take 500 mg by mouth daily.   Yes [provider]  MILK THISTLE PO Take 1 tablet by mouth daily.   Yes [provider]  oxyCODONE-acetaminophen (PERCOCET/ROXICET) 5-325 MG tablet Take 2 tablets by mouth every 6 (six) hours as needed for severe pain. 10/06/18   Brittany Maize, PA-C    Family History No family history on file.  Social History Social History   Tobacco Use   Smoking status: Never Smoker   Smokeless tobacco: Never Used  Substance Use Topics   Alcohol use: Yes    Alcohol/week: 1.0 standard drinks    Types: 1 Glasses of wine per week    Comment: occasionally   Drug use: No     Allergies   Adhesive [tape], Erythromycin, Macrobid [nitrofurantoin macrocrystal], and Other   Review of Systems Review of Systems  Constitutional: Negative for chills and fever.  HENT: Negative for congestion.   Eyes: Negative for visual disturbance.  Respiratory: Negative for cough and shortness of breath.   Cardiovascular: Negative for chest pain.  Gastrointestinal: Positive for abdominal pain and  diarrhea (looser stool). Negative for constipation, nausea and vomiting.  Genitourinary: Positive for flank pain. Negative for dysuria, frequency, pelvic pain, urgency, vaginal bleeding, vaginal discharge and vaginal pain.  Musculoskeletal: Negative for myalgias.  Skin: Negative for rash.  Neurological: Negative for headaches.     Physical Exam Updated Vital Signs BP 140/85 (BP Location: Right Arm)    Pulse 72    Temp 98.3 F (36.8 C) (Oral)    Resp 14    LMP  07/06/2015    SpO2 100%   Physical Exam Vitals signs and nursing note reviewed.  Constitutional:      Appearance: She is not ill-appearing.  HENT:     Head: Normocephalic and atraumatic.  Eyes:     Conjunctiva/sclera: Conjunctivae normal.  Neck:     Musculoskeletal: Neck supple.  Cardiovascular:     Rate and Rhythm: Normal rate and regular rhythm.     Heart sounds: Normal heart sounds.  Pulmonary:     Effort: Pulmonary effort is normal.     Breath sounds: Normal breath sounds. No wheezing, rhonchi or rales.  Abdominal:     Palpations: Abdomen is soft.     Tenderness: There is abdominal tenderness in the right lower quadrant. There is no right CVA tenderness, left CVA tenderness, guarding or rebound. Positive signs include McBurney's sign. Negative signs include Rovsing's sign, psoas sign and obturator sign.  Skin:    General: Skin is warm and dry.  Neurological:     Mental Status: She is alert.      ED Treatments / Results  Labs (all labs ordered are listed, but only abnormal results are displayed) Labs Reviewed  URINALYSIS, ROUTINE W REFLEX MICROSCOPIC - Abnormal; Notable for the following components:      Result Value   Color, Urine STRAW (*)    Hgb urine dipstick SMALL (*)    All other components within normal limits  LIPASE, BLOOD  COMPREHENSIVE METABOLIC PANEL  CBC  I-STAT BETA HCG BLOOD, ED ([facility], [facility], [facility] ONLY)    EKG None  Radiology US Transvaginal Non-ob  Result Date: 10/06/2018 CLINICAL DATA:  Right lower quadrant pain, adnexal mass on CT EXAM: TRANSABDOMINAL AND TRANSVAGINAL ULTRASOUND OF PELVIS DOPPLER ULTRASOUND OF OVARIES TECHNIQUE: Both transabdominal and transvaginal ultrasound examinations of the pelvis were performed. Transabdominal technique was performed for global imaging of the pelvis including uterus, ovaries, adnexal regions, and pelvic cul-de-sac. It was necessary to proceed with endovaginal exam following the transabdominal exam to visualize the  adnexa and ovaries. Color and duplex Doppler ultrasound was utilized to evaluate blood flow to the ovaries. COMPARISON:  CT 10/06/2018 FINDINGS: Uterus Status post hysterectomy Endometrium Status post hysterectomy Right ovary Measurements: 6.5 x 2.9 x 6.1 cm = volume: 59.6 mL. Multiple complex cysts, the largest were measured. Cystic mass containing fluid level and internal echoes measuring 3.8 x 2.7 x 3.2 cm. Intervening ovarian tissue with complex cyst containing internal echoes measuring 2.2 x 2.1 x 2.1 cm. Left ovary Not seen Pulsed Doppler evaluation of the right ovary demonstrates normal low-resistance arterial and venous waveforms. Other findings Small free fluid in the right adnexa and pelvis IMPRESSION: 1. Negative for right ovarian torsion.  Left ovary is nonvisualized 2. At least 2 complex cysts in the right ovary, possibly hemorrhagic cysts. Ultrasound follow-up in 6-12 weeks to ensure resolution is recommended. 3. Small free fluid in the pelvis and right adnexa. Electronically Signed   By: Donavan Foil M.D.   On: 10/06/2018 20:18   US  Pelvis Complete  Result Date: 10/06/2018 CLINICAL DATA:  Right lower quadrant pain, adnexal mass on CT EXAM: TRANSABDOMINAL AND TRANSVAGINAL ULTRASOUND OF PELVIS DOPPLER ULTRASOUND OF OVARIES TECHNIQUE: Both transabdominal and transvaginal ultrasound examinations of the pelvis were performed. Transabdominal technique was performed for global imaging of the pelvis including uterus, ovaries, adnexal regions, and pelvic cul-de-sac. It was necessary to proceed with endovaginal exam following the transabdominal exam to visualize the adnexa and ovaries. Color and duplex Doppler ultrasound was utilized to evaluate blood flow to the ovaries. COMPARISON:  CT 10/06/2018 FINDINGS: Uterus Status post hysterectomy Endometrium Status post hysterectomy Right ovary Measurements: 6.5 x 2.9 x 6.1 cm = volume: 59.6 mL. Multiple complex cysts, the largest were measured. Cystic mass  containing fluid level and internal echoes measuring 3.8 x 2.7 x 3.2 cm. Intervening ovarian tissue with complex cyst containing internal echoes measuring 2.2 x 2.1 x 2.1 cm. Left ovary Not seen Pulsed Doppler evaluation of the right ovary demonstrates normal low-resistance arterial and venous waveforms. Other findings Small free fluid in the right adnexa and pelvis IMPRESSION: 1. Negative for right ovarian torsion.  Left ovary is nonvisualized 2. At least 2 complex cysts in the right ovary, possibly hemorrhagic cysts. Ultrasound follow-up in 6-12 weeks to ensure resolution is recommended. 3. Small free fluid in the pelvis and right adnexa. Electronically Signed   By: Donavan Foil M.D.   On: 10/06/2018 20:18   Ct Abdomen Pelvis W Contrast  Result Date: 10/06/2018 CLINICAL DATA:  Right lower quadrant pain, right flank pain EXAM: CT ABDOMEN AND PELVIS WITH CONTRAST TECHNIQUE: Multidetector CT imaging of the abdomen and pelvis was performed using the standard protocol following bolus administration of intravenous contrast. CONTRAST:  154m OMNIPAQUE IOHEXOL 300 MG/ML  SOLN COMPARISON:  CT 07/10/2015 FINDINGS: Lower chest: Lung bases demonstrate no acute consolidation or effusion. Heart size is within normal limits. Hepatobiliary: No focal liver abnormality is seen. No gallstones, gallbladder wall thickening, or biliary dilatation. Pancreas: Unremarkable. No pancreatic ductal dilatation or surrounding inflammatory changes. Spleen: Normal in size without focal abnormality. Adrenals/Urinary Tract: Adrenal glands are unremarkable. Kidneys are normal, without renal calculi, focal lesion, or hydronephrosis. Bladder is unremarkable. Incompletely included kidneys on the delayed views. Stomach/Bowel: The stomach is nonenlarged. No dilated small bowel. No bowel wall thickening. The appendix is not well seen but no definitive right lower quadrant inflammatory process is identified. Vascular/Lymphatic: Nonaneurysmal aorta. No  significantly enlarged lymph nodes Reproductive: Status post hysterectomy. Asymmetric ovoid soft tissue in the right pelvis presumably represents the ovary. It appears contiguous with complex cysts in the right adnexa. Collectively, the complex cystic area measures 5.8 by 3.8 cm. Other: No free air.  Small free fluid in the pelvis. Right retroperitoneal soft tissue density adjacent to the vena cava at the level of lower pole right kidney, no change since 07/10/2015. This could represent dilated gonadal vessels. Musculoskeletal: No acute or suspicious osseous abnormality. Degenerative changes at L5-S1. IMPRESSION: 1. Asymmetric ovoid soft tissue in the right pelvis suspected to represent slightly prominent right ovary. It appears contiguous with exophytic complex cystic mass measuring up to 5.8 cm. Suggest correlation with pelvic ultrasound to exclude torsion given history of right lower quadrant pain 2. The appendix is not clearly identified but no secondary changes to suggest acute appendicitis. 3. Small moderate free fluid in the pelvis Electronically Signed   By: KDonavan FoilM.D.   On: 10/06/2018 19:18   [place] Flow Abd Pelv Doppler  Result Date: 10/06/2018 CLINICAL DATA:  Right lower quadrant pain, adnexal mass on CT EXAM: TRANSABDOMINAL AND TRANSVAGINAL ULTRASOUND OF PELVIS DOPPLER ULTRASOUND OF OVARIES TECHNIQUE: Both transabdominal and transvaginal ultrasound examinations of the pelvis were performed. Transabdominal technique was performed for global imaging of the pelvis including uterus, ovaries, adnexal regions, and pelvic cul-de-sac. It was necessary to proceed with endovaginal exam following the transabdominal exam to visualize the adnexa and ovaries. Color and duplex Doppler ultrasound was utilized to evaluate blood flow to the ovaries. COMPARISON:  CT 10/06/2018 FINDINGS: Uterus Status post hysterectomy Endometrium Status post hysterectomy Right ovary Measurements: 6.5 x 2.9 x 6.1 cm =  volume: 59.6 mL. Multiple complex cysts, the largest were measured. Cystic mass containing fluid level and internal echoes measuring 3.8 x 2.7 x 3.2 cm. Intervening ovarian tissue with complex cyst containing internal echoes measuring 2.2 x 2.1 x 2.1 cm. Left ovary Not seen Pulsed Doppler evaluation of the right ovary demonstrates normal low-resistance arterial and venous waveforms. Other findings Small free fluid in the right adnexa and pelvis IMPRESSION: 1. Negative for right ovarian torsion.  Left ovary is nonvisualized 2. At least 2 complex cysts in the right ovary, possibly hemorrhagic cysts. Ultrasound follow-up in 6-12 weeks to ensure resolution is recommended. 3. Small free fluid in the pelvis and right adnexa. Electronically Signed   By: Donavan Foil M.D.   On: 10/06/2018 20:18    Procedures Procedures (including critical care time)  Medications Ordered in ED Medications  sodium chloride flush (NS) 0.9 % injection 3 mL (3 mLs Intravenous Given 10/06/18 1737)  sodium chloride 0.9 % bolus 1,000 mL (0 mLs Intravenous Stopped 10/06/18 1928)  morphine 4 MG/ML injection 4 mg (4 mg Intravenous Given 10/06/18 1735)  iohexol (OMNIPAQUE) 300 MG/ML solution 100 mL (100 mLs Intravenous Contrast Given 10/06/18 1823)  morphine 4 MG/ML injection 4 mg (4 mg Intravenous Given 10/06/18 1905)     Initial Impression / Assessment and Plan / ED Course  I have reviewed the triage vital signs and the nursing notes.  Pertinent labs & imaging results that were available during my care of the patient were reviewed by me and considered in my medical decision making (see chart for details).  43 year old female presenting to the ED with right flank and RLQ abdominal pain x this AM. No other symptoms. Initially endorsed dysuria in triage; states her abdominal and flank pain is worsened with urination but pt is not having dysuria. No urinary symptoms. No vaginal symptoms. Pt is tender in RLQ with positive McBurney's sign  - concern for appendicitis today. Will obtain baseline bloodwork including CBC, CMP, lipase, preg test, and U/A. CT A/P ordered as well. 1 L NS bolus and morphine given for pain. Will reevaluate once labs and imaging return.  Labwork reassuring today. No leukocytosis. No electrolyte derangements. Still concerned for appendicitis at this point in time although lower on the differential without a WBC count. U/A without infection and microscopic Hgb - patient may be having a distal ureter kidney stone causing pain and blood in urine; this should be seen on CT with contrast.   CT scan with findings of ovarian mass measuring 5.3 cm; recommended pelvic ultrasound - will order in the ED tonight. Pt complaining of returning pain; another 4 mg morphine ordered. Will reevaluate once pelvic ultrasound results return.  Ultrasound with 2 hemorrhagic cysts on right ovary; recommend repeat in 6-12 weeks. Updated patient on results; she would like whomever is on call for Dr. Kennith Maes office to be consulted  at this time; happy to oblige - recommended outpatient follow up in office, see clinic course for more info on reccs. Pt stable for discharge at this time; sent home with short course of narcotic pain medicine.   Clinical Course as of Oct 06 2039  Sat Oct 06, 2018  1806 Upon reeval; pt reports pain has improved with 4 mg Morphine. Awaiting CT scan   [MV]  2030 Discussed case with Derrell Lolling Midwife who is on call for Dr. Kennith Maes office; she will make a note in their system - pt is to call first this Monday AM to be seen. Recommended short course of narcotic pain meds as pt was taking this in the past. Will prescribe and discharge home.    [MV]    Clinical Course User Index [MV] Brittany [name], [place]           Final Clinical Impressions(s) / ED Diagnoses   Final diagnoses:  Right lower quadrant abdominal pain  Cyst of right ovary    ED Discharge Orders         Ordered    oxyCODONE-acetaminophen  (PERCOCET/ROXICET) 5-325 MG tablet  Every 6 hours PRN     10/06/18 2039           Brittany Maize, PA-C 10/06/18 2046    Pattricia Boss, MD 10/09/18 [phone]

## 2018-10-06 NOTE — ED Notes (Signed)
Patient verbalizes understanding of discharge instructions. Opportunity for questioning and answers were provided. Armband removed by staff, pt discharged from ED.  

## 2018-10-06 NOTE — Discharge Instructions (Signed)
You were seen in the ED today for right lower abdominal pain; your labwork was reassuring but your CT scan and pelvic ultrasound both showed ovarian cysts on the right side which is likely causing your pain. A repeat ultrasound was recommended in 6-12 weeks. Derrell Lolling Midwife was made aware of you; she will put a note in the system to be seen on Monday - call first this Monday morning. Please take Ibuprofen as needed for pain; if you have break through pain I have prescribed a very short course of narcotic pain medication to take.

## 2018-10-06 NOTE — ED Triage Notes (Signed)
Pt here for eval of RLQ abdominal pain and right flank pain that started this morning around 830, endorses burning with urination. Denies hematuria. No N/V/D. Afebrile. Hx of UTIs.

## 2018-10-06 NOTE — ED Notes (Signed)
Patient transported to CT 

## 2018-10-07 ENCOUNTER — Emergency Department (HOSPITAL_COMMUNITY)
Admission: EM | Admit: 2018-10-07 | Discharge: 2018-10-07 | Disposition: A | Discharge status: Home / Self Care | Payer: Managed Care, Other (non HMO) | Attending: Emergency Medicine | Admitting: Emergency Medicine

## 2018-10-07 ENCOUNTER — Emergency Department (HOSPITAL_COMMUNITY): Payer: Managed Care, Other (non HMO)

## 2018-10-07 ENCOUNTER — Other Ambulatory Visit: Payer: Self-pay

## 2018-10-07 DIAGNOSIS — Z79899 Other long term (current) drug therapy: Secondary | ICD-10-CM | POA: Diagnosis not present

## 2018-10-07 DIAGNOSIS — Z85828 Personal history of other malignant neoplasm of skin: Secondary | ICD-10-CM | POA: Insufficient documentation

## 2018-10-07 DIAGNOSIS — R102 Pelvic and perineal pain: Secondary | ICD-10-CM | POA: Insufficient documentation

## 2018-10-07 DIAGNOSIS — R109 Unspecified abdominal pain: Secondary | ICD-10-CM | POA: Insufficient documentation

## 2018-10-07 DIAGNOSIS — I10 Essential (primary) hypertension: Secondary | ICD-10-CM | POA: Diagnosis not present

## 2018-10-07 DIAGNOSIS — N83201 Unspecified ovarian cyst, right side: Secondary | ICD-10-CM

## 2018-10-07 LAB — URINALYSIS, ROUTINE W REFLEX MICROSCOPIC
Bilirubin Urine: NEGATIVE
Glucose, UA: NEGATIVE mg/dL
Hgb urine dipstick: NEGATIVE
Ketones, ur: 80 mg/dL — AB
Leukocytes,Ua: NEGATIVE
Nitrite: NEGATIVE
Protein, ur: NEGATIVE mg/dL
Specific Gravity, Urine: 1.016 (ref 1.005–1.030)
pH: 6 (ref 5.0–8.0)

## 2018-10-07 LAB — CBC WITH DIFFERENTIAL/PLATELET
Abs Immature Granulocytes: 0.02 10*3/uL (ref 0.00–0.07)
Basophils Absolute: 0 10*3/uL (ref 0.0–0.1)
Basophils Relative: 0 %
Eosinophils Absolute: 0 10*3/uL (ref 0.0–0.5)
Eosinophils Relative: 1 %
HCT: 38.8 % (ref 36.0–46.0)
Hemoglobin: 13.3 g/dL (ref 12.0–15.0)
Immature Granulocytes: 0 %
Lymphocytes Relative: 11 %
Lymphs Abs: 0.9 10*3/uL (ref 0.7–4.0)
MCH: 31.1 pg (ref 26.0–34.0)
MCHC: 34.3 g/dL (ref 30.0–36.0)
MCV: 90.9 fL (ref 80.0–100.0)
Monocytes Absolute: 0.6 10*3/uL (ref 0.1–1.0)
Monocytes Relative: 7 %
Neutro Abs: 6.7 10*3/uL (ref 1.7–7.7)
Neutrophils Relative %: 81 %
Platelets: 224 10*3/uL (ref 150–400)
RBC: 4.27 MIL/uL (ref 3.87–5.11)
RDW: 12.7 % (ref 11.5–15.5)
WBC: 8.3 10*3/uL (ref 4.0–10.5)
nRBC: 0 % (ref 0.0–0.2)

## 2018-10-07 LAB — COMPREHENSIVE METABOLIC PANEL
ALT: 13 U/L (ref 0–44)
AST: 18 U/L (ref 15–41)
Albumin: 3.9 g/dL (ref 3.5–5.0)
Alkaline Phosphatase: 39 U/L (ref 38–126)
Anion gap: 11 (ref 5–15)
BUN: 12 mg/dL (ref 6–20)
CO2: 22 mmol/L (ref 22–32)
Calcium: 8.7 mg/dL — ABNORMAL LOW (ref 8.9–10.3)
Chloride: 104 mmol/L (ref 98–111)
Creatinine, Ser: 0.79 mg/dL (ref 0.44–1.00)
GFR calc Af Amer: >60 mL/min (ref >60)
GFR calc non Af Amer: >60 mL/min (ref >60)
Glucose, Bld: 127 mg/dL — ABNORMAL HIGH (ref 70–99)
Potassium: 3.7 mmol/L (ref 3.5–5.1)
Sodium: 137 mmol/L (ref 135–145)
Total Bilirubin: 0.7 mg/dL (ref 0.3–1.2)
Total Protein: 6.8 g/dL (ref 6.5–8.1)

## 2018-10-07 LAB — LIPASE, BLOOD: Lipase: 37 U/L (ref 11–51)

## 2018-10-07 MED ORDER — SODIUM CHLORIDE 0.9 % IV BOLUS
1000.0000 mL | Freq: Once | INTRAVENOUS | Status: AC
  Administered 2018-10-07: 1000 mL via INTRAVENOUS

## 2018-10-07 MED ORDER — PROMETHAZINE HCL 25 MG/ML IJ SOLN
12.5000 mg | Freq: Once | INTRAMUSCULAR | Status: AC
  Administered 2018-10-07: 12.5 mg via INTRAVENOUS
  Filled 2018-10-07: qty 1

## 2018-10-07 MED ORDER — ONDANSETRON HCL 4 MG/2ML IJ SOLN
INTRAMUSCULAR | Status: AC
  Filled 2018-10-07: qty 2

## 2018-10-07 MED ORDER — PROMETHAZINE HCL 25 MG/ML IJ SOLN: 12.5000 mg | Freq: Once | INTRAMUSCULAR | Status: DC

## 2018-10-07 MED ORDER — ONDANSETRON HCL 4 MG/2ML IJ SOLN
4.0000 mg | Freq: Once | INTRAMUSCULAR | Status: AC
  Administered 2018-10-07: 4 mg via INTRAVENOUS
  Filled 2018-10-07: qty 2

## 2018-10-07 MED ORDER — ONDANSETRON HCL 4 MG/2ML IJ SOLN
4.0000 mg | Freq: Once | INTRAMUSCULAR | Status: AC
  Administered 2018-10-07: 4 mg via INTRAVENOUS

## 2018-10-07 MED ORDER — HYDROMORPHONE HCL 1 MG/ML IJ SOLN
1.0000 mg | Freq: Once | INTRAMUSCULAR | Status: AC
  Administered 2018-10-07: 16:00:00 1 mg via INTRAVENOUS
  Filled 2018-10-07: qty 1

## 2018-10-07 MED ORDER — KETOROLAC TROMETHAMINE 60 MG/2ML IM SOLN
60.0000 mg | Freq: Once | INTRAMUSCULAR | Status: AC
  Administered 2018-10-07: 19:00:00 60 mg via INTRAMUSCULAR
  Filled 2018-10-07: qty 2

## 2018-10-07 MED ORDER — ONDANSETRON 4 MG PO TBDP: 4.0000 mg | ORAL_TABLET | Freq: Three times a day (TID) | ORAL | 0 refills | Status: AC | PRN

## 2018-10-07 MED ORDER — MORPHINE SULFATE (PF) 4 MG/ML IV SOLN
4.0000 mg | Freq: Once | INTRAVENOUS | Status: AC
  Administered 2018-10-07: 4 mg via INTRAVENOUS
  Filled 2018-10-07: qty 1

## 2018-10-07 MED ORDER — OXYCODONE-ACETAMINOPHEN 5-325 MG PO TABS: 1.0000 | ORAL_TABLET | Freq: Four times a day (QID) | ORAL | 0 refills | Status: AC | PRN

## 2018-10-07 NOTE — ED Triage Notes (Signed)
Pt arrives with [facility] from home c/o "severe" abdominal pain and back pain. Pt states pain is right sided and radiates to back. Pt was evaluated in the ED yesterday and dx'd with hemorrhagic ovarian cyst. Pt has given hydrocodone for pain. Pt states she spoke to Dr. Edwyna Ready at [facility] today. Pt reports nausea and was given 4 mg zofran, and 200 mcg of fentanyl for pain en route to ED by EMS.

## 2018-10-07 NOTE — ED Notes (Signed)
Patient verbalizes understanding of discharge instructions. Opportunity for questioning and answers were provided. Armband removed by staff, pt discharged from ED.  

## 2018-10-07 NOTE — Discharge Instructions (Signed)
You have been seen today for worsening pain. Please read and follow all provided instructions. Return to the emergency room for worsening condition or new concerning symptoms.    1. Medications:  Prescriptions sent to your pharmacy for Percocet and Zofran.  Please take as prescribed.  Percocet should be taken for severe pain only. Continue usual home medications Take medications as prescribed. Please review all of the medicines and only take them if you do not have an allergy to them.   2. Treatment: rest, drink plenty of fluids  3. Follow Up: Please call Dr. Kennith Maes office first thing in the morning as planned.  It is also a possibility that you have an allergic reaction to any of the medicines that you have been prescribed - Everybody reacts differently to medications and while MOST people have no trouble with most medicines, you may have a reaction such as nausea, vomiting, rash, swelling, shortness of breath. If this is the case, please stop taking the medicine immediately and contact your physician.  ?

## 2018-10-07 NOTE — ED Notes (Signed)
Patient transported to Ultrasound 

## 2018-10-07 NOTE — ED Provider Notes (Signed)
[facility] EMERGENCY DEPARTMENT Provider Note   CSN: [id number] Arrival date & time: 10/07/18  1439    History   Chief Complaint Chief Complaint  Patient presents with   Abdominal Pain   Back Pain    HPI Brittany [name] is a 43 y.o. female with past medical history of hypertension, headache, skin cancer presents emergency department today with chief complaint of right flank and right lower quadrant pain x2 days.   Patient was seen in the ED yesterday for similar complaint.  Imaging was significant for CT scan with right 5.3 cm ovarian mass.  Ultrasound was recommended that revealed 2 hemorrhagic cysts on right ovary.  Labs were overall unremarkable, UA with hemoglobin.  Patient was discussed with midwife for Dr. Kennith Maes office and plan was established for patient to call the office first thing Monday morning.  She was discharged with narcotic pain medicine, last dose of pain medicine was 6 hours ago.  Patient states the pain has worsened since discharge.  She is unable to tolerate p.o. intake.  She was not able to sleep last night because of the pain.  She took the hydrocodone without symptom relief.  Patient states the pain is sharp.  She has pain in right flank that radiates to right lower quadrant.  She reports the pain is significantly worse than yesterday, causing her to double over with onset.  She reports nausea without emesis.  She continues to endorse the flank pain is worse with urination but denies dysuria. She denies fever, chills, pelvic pain, vaginal discharge or vaginal itching, shortness of breath, chest pain.  History provided by patient with additional history obtained from chart review.       Past Medical History:  Diagnosis Date   Cancer ([place])    skin cancer stage 1 100% removed   Headache    Hypertension    PONV (postoperative nausea and vomiting)     Patient Active Problem List   Diagnosis Date Noted   Dysmenorrhea 07/06/2015     Past Surgical History:  Procedure Laterality Date   LAPAROSCOPY     ROBOTIC ASSISTED LAPAROSCOPIC LYSIS OF ADHESION N/A 01/03/2013   Procedure: ROBOTIC ASSISTED LAPAROSCOPIC LYSIS OF ADHESION; Ablation of Endometriosis;  Surgeon: Lovenia Kim, MD;  Location: [facility];  Service: Gynecology;  Laterality: N/A;   ROBOTIC ASSISTED TOTAL HYSTERECTOMY WITH SALPINGECTOMY Bilateral 07/06/2015   Procedure: ROBOTIC ASSISTED TOTAL HYSTERECTOMY WITH SALPINGECTOMY/Ablation of Endometriosis;  Surgeon: Brien Few, MD;  Location: [facility];  Service: Gynecology;  Laterality: Bilateral;     OB History   No obstetric history on file.      Home Medications    Prior to Admission medications   Medication Sig Start Date End Date Taking? Authorizing Provider  aspirin-acetaminophen-caffeine (EXCEDRIN MIGRAINE) [phone] MG tablet Take 2 tablets by mouth every 6 (six) hours as needed for headache or migraine.   Yes [provider]  ELDERBERRY PO Take 10 mLs by mouth daily.   Yes [provider]  ibuprofen (ADVIL) 200 MG tablet Take 400 mg by mouth every 6 (six) hours as needed for headache (pain).   Yes [provider]  lisinopril (ZESTRIL) 10 MG tablet Take 10 mg by mouth daily.   Yes [provider]  Magnesium 500 MG TABS Take 500 mg by mouth daily.   Yes [provider]  MILK THISTLE PO Take 1 tablet by mouth daily.   Yes [provider]  ondansetron (ZOFRAN ODT) 4 MG disintegrating  tablet Take 1 tablet (4 mg total) by mouth every 8 (eight) hours as needed for nausea or vomiting. 10/07/18   Harpreet Pompey, Harley Hallmark, PA-C  oxyCODONE-acetaminophen (PERCOCET/ROXICET) 5-325 MG tablet Take 1 tablet by mouth every 6 (six) hours as needed for severe pain. 10/07/18   Makaveli Hoard, Harley Hallmark, PA-C    Family History No family history on file.  Social History Social History   Tobacco Use   Smoking status: Never Smoker   Smokeless tobacco: Never Used   Substance Use Topics   Alcohol use: Yes    Alcohol/week: 1.0 standard drinks    Types: 1 Glasses of wine per week    Comment: occasionally   Drug use: No     Allergies   Adhesive [tape], Erythromycin, Macrobid [nitrofurantoin macrocrystal], and Other   Review of Systems Review of Systems  Constitutional: Negative for chills and fever.  HENT: Negative for congestion, ear discharge, ear pain, sinus pressure, sinus pain and sore throat.   Eyes: Negative for pain and redness.  Respiratory: Negative for cough and shortness of breath.   Cardiovascular: Negative for chest pain.  Gastrointestinal: Positive for abdominal pain and nausea. Negative for constipation, diarrhea and vomiting.  Genitourinary: Positive for flank pain. Negative for difficulty urinating, dysuria, hematuria, pelvic pain, vaginal bleeding, vaginal discharge and vaginal pain.  Musculoskeletal: Negative for back pain and neck pain.  Skin: Negative for wound.  Neurological: Negative for weakness, numbness and headaches.     Physical Exam Updated Vital Signs BP 108/74    Pulse 77    Temp 98.7 F (37.1 C) (Oral)    Resp 16    Ht 5' 3"  (1.6 m)    Wt 56.7 kg    LMP 07/06/2015    SpO2 96%    BMI 22.14 kg/m   Physical Exam Vitals signs and nursing note reviewed.  Constitutional:      General: She is not in acute distress.    Comments: Patient appears very uncomfortable, she is rocking back and forth on the stretcher.  HENT:     Head: Normocephalic and atraumatic.     Right Ear: Tympanic membrane and external ear normal.     Left Ear: Tympanic membrane and external ear normal.     Nose: Nose normal.     Mouth/Throat:     Mouth: Mucous membranes are dry.     Pharynx: Oropharynx is clear.  Eyes:     General: No scleral icterus.       Right eye: No discharge.        Left eye: No discharge.     Extraocular Movements: Extraocular movements intact.     Conjunctiva/sclera: Conjunctivae normal.     Pupils: Pupils  are equal, round, and reactive to light.  Neck:     Musculoskeletal: Normal range of motion.     Vascular: No JVD.  Cardiovascular:     Rate and Rhythm: Normal rate and regular rhythm.     Pulses: Normal pulses.          Radial pulses are 2+ on the right side and 2+ on the left side.     Heart sounds: Normal heart sounds.  Pulmonary:     Comments: Lungs clear to auscultation in all fields. Symmetric chest rise. No wheezing, rales, or rhonchi. Abdominal:     Comments: Abdomen is soft, non-distended.  She is exquisitely tender to right lower quadrant with guarding. No rigidity, no rebound.   Musculoskeletal: Normal range of motion.  Skin:  General: Skin is warm and dry.     Capillary Refill: Capillary refill takes less than 2 seconds.  Neurological:     Mental Status: She is oriented to person, place, and time.     GCS: GCS eye subscore is 4. GCS verbal subscore is 5. GCS motor subscore is 6.     Comments: Fluent speech, no facial droop.  Psychiatric:        Behavior: Behavior normal.      ED Treatments / Results  Labs (all labs ordered are listed, but only abnormal results are displayed) Labs Reviewed  URINALYSIS, ROUTINE W REFLEX MICROSCOPIC - Abnormal; Notable for the following components:      Result Value   Ketones, ur 80 (*)    All other components within normal limits  COMPREHENSIVE METABOLIC PANEL - Abnormal; Notable for the following components:   Glucose, Bld 127 (*)    Calcium 8.7 (*)    All other components within normal limits  CBC WITH DIFFERENTIAL/PLATELET  LIPASE, BLOOD    EKG None  Radiology US Transvaginal Non-ob  Result Date: 10/07/2018 CLINICAL DATA:  Increasing pain. EXAM: TRANSABDOMINAL AND TRANSVAGINAL ULTRASOUND OF PELVIS DOPPLER ULTRASOUND OF OVARIES TECHNIQUE: Both transabdominal and transvaginal ultrasound examinations of the pelvis were performed. Transabdominal technique was performed for global imaging of the pelvis including uterus,  ovaries, adnexal regions, and pelvic cul-de-sac. It was necessary to proceed with endovaginal exam following the transabdominal exam to visualize the right ovary. Color and duplex Doppler ultrasound was utilized to evaluate blood flow to the ovaries. COMPARISON:  CT scan October 06, 2018. Pelvic ultrasound October 06, 2018. FINDINGS: Uterus Surgically absent. Right ovary There is an oval structure in the right side of the pelvis containing 3 cystic components. The total measurement is 6.6 x 3.5 x 6.2 cm with a volume of 73.2 mL. Yesterday, the structure measured 6.5 x 2.9 x 6.1 cm with a volume of 60 mL. The difference in size may be technical in nature. The cystic components are complex with internal debris. There appears to be a fluid fluid level in the largest of these cystic components also present on the previous ultrasound and CT. Image 62 of the previous CT suggested the possibility of high attenuation material layering posteriorly in the largest cystic component. There is a rim of tissue surrounding the cystic components. Arterial and venous blood flow is seen within the surrounding rim of soft tissue. The cystic components measure 4 x 3 x 3.2 cm, 2 x 2 x 2.1 cm, and 1.5 x 1.5 x 1.5 cm. A separate right ovary is not identified. Left ovary Not visualized. Pulsed Doppler evaluation of the apparent right ovary demonstrates normal low-resistance arterial and venous waveforms. Other findings Small to moderate free fluid is seen in the pelvis. IMPRESSION: 1. The structure in the right side of the pelvis containing 3 complex cystic regions is favored to represent a right ovary containing 3 cysts, at least 1 of which is probably hemorrhagic. The findings are similar since the comparison studies from yesterday. Arterial and venous blood flow is seen in the rim of peripheral tissue with no evidence of torsion. A separate right ovary is not visualized. 2. Small to moderate fluid in the pelvis is likely physiologic. 3. The  patient is status post hysterectomy. 4. The left ovary was not visualized. Electronically Signed   By: Dorise Bullion III M.D   On: 10/07/2018 16:52   US Transvaginal Non-ob  Result Date: 10/06/2018 CLINICAL DATA:  Right lower quadrant pain, adnexal mass on CT EXAM: TRANSABDOMINAL AND TRANSVAGINAL ULTRASOUND OF PELVIS DOPPLER ULTRASOUND OF OVARIES TECHNIQUE: Both transabdominal and transvaginal ultrasound examinations of the pelvis were performed. Transabdominal technique was performed for global imaging of the pelvis including uterus, ovaries, adnexal regions, and pelvic cul-de-sac. It was necessary to proceed with endovaginal exam following the transabdominal exam to visualize the adnexa and ovaries. Color and duplex Doppler ultrasound was utilized to evaluate blood flow to the ovaries. COMPARISON:  CT 10/06/2018 FINDINGS: Uterus Status post hysterectomy Endometrium Status post hysterectomy Right ovary Measurements: 6.5 x 2.9 x 6.1 cm = volume: 59.6 mL. Multiple complex cysts, the largest were measured. Cystic mass containing fluid level and internal echoes measuring 3.8 x 2.7 x 3.2 cm. Intervening ovarian tissue with complex cyst containing internal echoes measuring 2.2 x 2.1 x 2.1 cm. Left ovary Not seen Pulsed Doppler evaluation of the right ovary demonstrates normal low-resistance arterial and venous waveforms. Other findings Small free fluid in the right adnexa and pelvis IMPRESSION: 1. Negative for right ovarian torsion.  Left ovary is nonvisualized 2. At least 2 complex cysts in the right ovary, possibly hemorrhagic cysts. Ultrasound follow-up in 6-12 weeks to ensure resolution is recommended. 3. Small free fluid in the pelvis and right adnexa. Electronically Signed   By: Donavan Foil M.D.   On: 10/06/2018 20:18   US Pelvis Complete  Result Date: 10/07/2018 CLINICAL DATA:  Increasing pain. EXAM: TRANSABDOMINAL AND TRANSVAGINAL ULTRASOUND OF PELVIS DOPPLER ULTRASOUND OF OVARIES TECHNIQUE: Both  transabdominal and transvaginal ultrasound examinations of the pelvis were performed. Transabdominal technique was performed for global imaging of the pelvis including uterus, ovaries, adnexal regions, and pelvic cul-de-sac. It was necessary to proceed with endovaginal exam following the transabdominal exam to visualize the right ovary. Color and duplex Doppler ultrasound was utilized to evaluate blood flow to the ovaries. COMPARISON:  CT scan October 06, 2018. Pelvic ultrasound October 06, 2018. FINDINGS: Uterus Surgically absent. Right ovary There is an oval structure in the right side of the pelvis containing 3 cystic components. The total measurement is 6.6 x 3.5 x 6.2 cm with a volume of 73.2 mL. Yesterday, the structure measured 6.5 x 2.9 x 6.1 cm with a volume of 60 mL. The difference in size may be technical in nature. The cystic components are complex with internal debris. There appears to be a fluid fluid level in the largest of these cystic components also present on the previous ultrasound and CT. Image 62 of the previous CT suggested the possibility of high attenuation material layering posteriorly in the largest cystic component. There is a rim of tissue surrounding the cystic components. Arterial and venous blood flow is seen within the surrounding rim of soft tissue. The cystic components measure 4 x 3 x 3.2 cm, 2 x 2 x 2.1 cm, and 1.5 x 1.5 x 1.5 cm. A separate right ovary is not identified. Left ovary Not visualized. Pulsed Doppler evaluation of the apparent right ovary demonstrates normal low-resistance arterial and venous waveforms. Other findings Small to moderate free fluid is seen in the pelvis. IMPRESSION: 1. The structure in the right side of the pelvis containing 3 complex cystic regions is favored to represent a right ovary containing 3 cysts, at least 1 of which is probably hemorrhagic. The findings are similar since the comparison studies from yesterday. Arterial and venous blood flow is seen  in the rim of peripheral tissue with no evidence of torsion. A separate right ovary is not  visualized. 2. Small to moderate fluid in the pelvis is likely physiologic. 3. The patient is status post hysterectomy. 4. The left ovary was not visualized. Electronically Signed   By: Dorise Bullion III M.D   On: 10/07/2018 16:52   US Pelvis Complete  Result Date: 10/06/2018 CLINICAL DATA:  Right lower quadrant pain, adnexal mass on CT EXAM: TRANSABDOMINAL AND TRANSVAGINAL ULTRASOUND OF PELVIS DOPPLER ULTRASOUND OF OVARIES TECHNIQUE: Both transabdominal and transvaginal ultrasound examinations of the pelvis were performed. Transabdominal technique was performed for global imaging of the pelvis including uterus, ovaries, adnexal regions, and pelvic cul-de-sac. It was necessary to proceed with endovaginal exam following the transabdominal exam to visualize the adnexa and ovaries. Color and duplex Doppler ultrasound was utilized to evaluate blood flow to the ovaries. COMPARISON:  CT 10/06/2018 FINDINGS: Uterus Status post hysterectomy Endometrium Status post hysterectomy Right ovary Measurements: 6.5 x 2.9 x 6.1 cm = volume: 59.6 mL. Multiple complex cysts, the largest were measured. Cystic mass containing fluid level and internal echoes measuring 3.8 x 2.7 x 3.2 cm. Intervening ovarian tissue with complex cyst containing internal echoes measuring 2.2 x 2.1 x 2.1 cm. Left ovary Not seen Pulsed Doppler evaluation of the right ovary demonstrates normal low-resistance arterial and venous waveforms. Other findings Small free fluid in the right adnexa and pelvis IMPRESSION: 1. Negative for right ovarian torsion.  Left ovary is nonvisualized 2. At least 2 complex cysts in the right ovary, possibly hemorrhagic cysts. Ultrasound follow-up in 6-12 weeks to ensure resolution is recommended. 3. Small free fluid in the pelvis and right adnexa. Electronically Signed   By: Donavan Foil M.D.   On: 10/06/2018 20:18   Ct Abdomen Pelvis  W Contrast  Result Date: 10/06/2018 CLINICAL DATA:  Right lower quadrant pain, right flank pain EXAM: CT ABDOMEN AND PELVIS WITH CONTRAST TECHNIQUE: Multidetector CT imaging of the abdomen and pelvis was performed using the standard protocol following bolus administration of intravenous contrast. CONTRAST:  169m OMNIPAQUE IOHEXOL 300 MG/ML  SOLN COMPARISON:  CT 07/10/2015 FINDINGS: Lower chest: Lung bases demonstrate no acute consolidation or effusion. Heart size is within normal limits. Hepatobiliary: No focal liver abnormality is seen. No gallstones, gallbladder wall thickening, or biliary dilatation. Pancreas: Unremarkable. No pancreatic ductal dilatation or surrounding inflammatory changes. Spleen: Normal in size without focal abnormality. Adrenals/Urinary Tract: Adrenal glands are unremarkable. Kidneys are normal, without renal calculi, focal lesion, or hydronephrosis. Bladder is unremarkable. Incompletely included kidneys on the delayed views. Stomach/Bowel: The stomach is nonenlarged. No dilated small bowel. No bowel wall thickening. The appendix is not well seen but no definitive right lower quadrant inflammatory process is identified. Vascular/Lymphatic: Nonaneurysmal aorta. No significantly enlarged lymph nodes Reproductive: Status post hysterectomy. Asymmetric ovoid soft tissue in the right pelvis presumably represents the ovary. It appears contiguous with complex cysts in the right adnexa. Collectively, the complex cystic area measures 5.8 by 3.8 cm. Other: No free air.  Small free fluid in the pelvis. Right retroperitoneal soft tissue density adjacent to the vena cava at the level of lower pole right kidney, no change since 07/10/2015. This could represent dilated gonadal vessels. Musculoskeletal: No acute or suspicious osseous abnormality. Degenerative changes at L5-S1. IMPRESSION: 1. Asymmetric ovoid soft tissue in the right pelvis suspected to represent slightly prominent right ovary. It appears  contiguous with exophytic complex cystic mass measuring up to 5.8 cm. Suggest correlation with pelvic ultrasound to exclude torsion given history of right lower quadrant pain 2. The appendix is not clearly identified  but no secondary changes to suggest acute appendicitis. 3. Small moderate free fluid in the pelvis Electronically Signed   By: Donavan Foil M.D.   On: 10/06/2018 19:18   [place] Art/ven Flow Abd Pelv Doppler  Result Date: 10/07/2018 CLINICAL DATA:  Increasing pain. EXAM: TRANSABDOMINAL AND TRANSVAGINAL ULTRASOUND OF PELVIS DOPPLER ULTRASOUND OF OVARIES TECHNIQUE: Both transabdominal and transvaginal ultrasound examinations of the pelvis were performed. Transabdominal technique was performed for global imaging of the pelvis including uterus, ovaries, adnexal regions, and pelvic cul-de-sac. It was necessary to proceed with endovaginal exam following the transabdominal exam to visualize the right ovary. Color and duplex Doppler ultrasound was utilized to evaluate blood flow to the ovaries. COMPARISON:  CT scan October 06, 2018. Pelvic ultrasound October 06, 2018. FINDINGS: Uterus Surgically absent. Right ovary There is an oval structure in the right side of the pelvis containing 3 cystic components. The total measurement is 6.6 x 3.5 x 6.2 cm with a volume of 73.2 mL. Yesterday, the structure measured 6.5 x 2.9 x 6.1 cm with a volume of 60 mL. The difference in size may be technical in nature. The cystic components are complex with internal debris. There appears to be a fluid fluid level in the largest of these cystic components also present on the previous ultrasound and CT. Image 62 of the previous CT suggested the possibility of high attenuation material layering posteriorly in the largest cystic component. There is a rim of tissue surrounding the cystic components. Arterial and venous blood flow is seen within the surrounding rim of soft tissue. The cystic components measure 4 x 3 x 3.2 cm, 2 x 2 x 2.1 cm,  and 1.5 x 1.5 x 1.5 cm. A separate right ovary is not identified. Left ovary Not visualized. Pulsed Doppler evaluation of the apparent right ovary demonstrates normal low-resistance arterial and venous waveforms. Other findings Small to moderate free fluid is seen in the pelvis. IMPRESSION: 1. The structure in the right side of the pelvis containing 3 complex cystic regions is favored to represent a right ovary containing 3 cysts, at least 1 of which is probably hemorrhagic. The findings are similar since the comparison studies from yesterday. Arterial and venous blood flow is seen in the rim of peripheral tissue with no evidence of torsion. A separate right ovary is not visualized. 2. Small to moderate fluid in the pelvis is likely physiologic. 3. The patient is status post hysterectomy. 4. The left ovary was not visualized. Electronically Signed   By: Dorise Bullion III M.D   On: 10/07/2018 16:52   [place] Art/ven Flow Abd Pelv Doppler  Result Date: 10/06/2018 CLINICAL DATA:  Right lower quadrant pain, adnexal mass on CT EXAM: TRANSABDOMINAL AND TRANSVAGINAL ULTRASOUND OF PELVIS DOPPLER ULTRASOUND OF OVARIES TECHNIQUE: Both transabdominal and transvaginal ultrasound examinations of the pelvis were performed. Transabdominal technique was performed for global imaging of the pelvis including uterus, ovaries, adnexal regions, and pelvic cul-de-sac. It was necessary to proceed with endovaginal exam following the transabdominal exam to visualize the adnexa and ovaries. Color and duplex Doppler ultrasound was utilized to evaluate blood flow to the ovaries. COMPARISON:  CT 10/06/2018 FINDINGS: Uterus Status post hysterectomy Endometrium Status post hysterectomy Right ovary Measurements: 6.5 x 2.9 x 6.1 cm = volume: 59.6 mL. Multiple complex cysts, the largest were measured. Cystic mass containing fluid level and internal echoes measuring 3.8 x 2.7 x 3.2 cm. Intervening ovarian tissue with complex cyst containing internal  echoes measuring 2.2 x 2.1 x 2.1  cm. Left ovary Not seen Pulsed Doppler evaluation of the right ovary demonstrates normal low-resistance arterial and venous waveforms. Other findings Small free fluid in the right adnexa and pelvis IMPRESSION: 1. Negative for right ovarian torsion.  Left ovary is nonvisualized 2. At least 2 complex cysts in the right ovary, possibly hemorrhagic cysts. Ultrasound follow-up in 6-12 weeks to ensure resolution is recommended. 3. Small free fluid in the pelvis and right adnexa. Electronically Signed   By: Donavan Foil M.D.   On: 10/06/2018 20:18    Procedures Procedures (including critical care time)  Medications Ordered in ED Medications  promethazine (PHENERGAN) injection 12.5 mg (has no administration in time range)  ketorolac (TORADOL) injection 60 mg (has no administration in time range)  ondansetron (ZOFRAN) injection 4 mg (4 mg Intravenous Given 10/07/18 1544)  HYDROmorphone (DILAUDID) injection 1 mg (1 mg Intravenous Given 10/07/18 1537)  morphine 4 MG/ML injection 4 mg (4 mg Intravenous Given 10/07/18 1539)  sodium chloride 0.9 % bolus 1,000 mL (1,000 mLs Intravenous New Bag/Given 10/07/18 1547)  ondansetron (ZOFRAN) injection 4 mg (4 mg Intravenous Given 10/07/18 1647)     Initial Impression / Assessment and Plan / ED Course  I have reviewed the triage vital signs and the nursing notes.  Pertinent labs & imaging results that were available during my care of the patient were reviewed by me and considered in my medical decision making (see chart for details).  43 year old female presents with right flank pain that radiates to right lower quadrant.  Chart review shows she was seen here yesterday for similar pain however after being discharged the pain has significantly worsened and brought her to her knees earlier this morning.  On arrival she is uncomfortable appearing.  Vital signs are stable, she is afebrile, normotensive not tachycardic.  On exam she has  right CVA tenderness as well as tenderness to palpation of right lower quadrant.  I reviewed patient's chart from yesterday where she had CT abdomen pelvis as well as rule out torsion ultrasound.  Given her worsening pain DDx includes ovarian torsion therefore will obtain ultrasound again today.  Also plan to check basic labs to look for infection, low suspicion as labs are unremarkable yesterday.  Will give IV fluids, morphine and Zofran, will reassess.  Lab work without significant findings.  UA with ketones suggestive of dehydration as patient has had decreased p.o. intake. Ultrasounds today are negative for ovarian torsion.  Findings are similar compared to yesterday.  On reassessment patient continues to have RLQ pain and nausea after IV morphine, dilaudid, zofran, phenergan. She now rates pain is 5/10 in severity, improvement from 10/10 on arrival.  She is concerned about pain control at home.  Case discussed with gynecology Dr. Garwin Brothers who recommends 64m IM toradol and discharge home with pain medications with plan to follow up tomorrow morning with Dr. TRonita Hippsas originally planned. PDMP reviewed during this encounter. Pt has score of 000 .   Patient is comfortable with above plan and is stable for discharge at this time.  She is tolerating p.o. intake.  Abdomen is benign on repeat exam.  All questions were answered prior to disposition. Strict return precautions for returning to the ED were discussed. Findings and plan of care discussed with supervising physician Dr. LVanita Panda  This note was prepared using Dragon voice recognition software and may include unintentional dictation errors due to the inherent limitations of voice recognition software.   Final Clinical Impressions(s) / ED Diagnoses   Final  diagnoses:  Cyst of right ovary    ED Discharge Orders         Ordered    oxyCODONE-acetaminophen (PERCOCET/ROXICET) 5-325 MG tablet  Every 6 hours PRN     10/07/18 1837    ondansetron  (ZOFRAN ODT) 4 MG disintegrating tablet  Every 8 hours PRN     10/07/18 1837           Cherre Robins, PA-C 10/07/18 1846    Carmin Muskrat, MD 10/08/18 1345

## 2019-03-19 ENCOUNTER — Other Ambulatory Visit: Payer: Managed Care, Other (non HMO)

## 2019-04-03 ENCOUNTER — Ambulatory Visit: Payer: Managed Care, Other (non HMO) | Attending: Internal Medicine

## 2019-04-03 DIAGNOSIS — Z20822 Contact with and (suspected) exposure to covid-19: Secondary | ICD-10-CM

## 2019-04-04 LAB — NOVEL CORONAVIRUS, NAA: SARS-CoV-2, NAA: NOT DETECTED

## 2020-05-14 DIAGNOSIS — U071 COVID-19: Secondary | ICD-10-CM | POA: Diagnosis not present

## 2020-05-25 DIAGNOSIS — F411 Generalized anxiety disorder: Secondary | ICD-10-CM | POA: Diagnosis not present

## 2020-05-25 DIAGNOSIS — G4700 Insomnia, unspecified: Secondary | ICD-10-CM | POA: Diagnosis not present

## 2020-05-25 DIAGNOSIS — I10 Essential (primary) hypertension: Secondary | ICD-10-CM | POA: Diagnosis not present

## 2020-05-25 DIAGNOSIS — F331 Major depressive disorder, recurrent, moderate: Secondary | ICD-10-CM | POA: Diagnosis not present

## 2020-06-17 DIAGNOSIS — D225 Melanocytic nevi of trunk: Secondary | ICD-10-CM | POA: Diagnosis not present

## 2020-06-17 DIAGNOSIS — L821 Other seborrheic keratosis: Secondary | ICD-10-CM | POA: Diagnosis not present

## 2020-06-17 DIAGNOSIS — Z86018 Personal history of other benign neoplasm: Secondary | ICD-10-CM | POA: Diagnosis not present

## 2020-06-17 DIAGNOSIS — Z85820 Personal history of malignant melanoma of skin: Secondary | ICD-10-CM | POA: Diagnosis not present

## 2020-07-08 DIAGNOSIS — Z1231 Encounter for screening mammogram for malignant neoplasm of breast: Secondary | ICD-10-CM | POA: Diagnosis not present

## 2020-07-08 DIAGNOSIS — Z01419 Encounter for gynecological examination (general) (routine) without abnormal findings: Secondary | ICD-10-CM | POA: Diagnosis not present

## 2020-07-08 DIAGNOSIS — Z6825 Body mass index (BMI) 25.0-25.9, adult: Secondary | ICD-10-CM | POA: Diagnosis not present

## 2020-07-08 DIAGNOSIS — N959 Unspecified menopausal and perimenopausal disorder: Secondary | ICD-10-CM | POA: Diagnosis not present

## 2020-07-15 DIAGNOSIS — I10 Essential (primary) hypertension: Secondary | ICD-10-CM | POA: Diagnosis not present

## 2020-07-15 DIAGNOSIS — F411 Generalized anxiety disorder: Secondary | ICD-10-CM | POA: Diagnosis not present

## 2020-07-15 DIAGNOSIS — F331 Major depressive disorder, recurrent, moderate: Secondary | ICD-10-CM | POA: Diagnosis not present

## 2020-07-15 DIAGNOSIS — G4700 Insomnia, unspecified: Secondary | ICD-10-CM | POA: Diagnosis not present

## 2020-07-19 DIAGNOSIS — Z1212 Encounter for screening for malignant neoplasm of rectum: Secondary | ICD-10-CM | POA: Diagnosis not present

## 2020-07-19 DIAGNOSIS — Z1211 Encounter for screening for malignant neoplasm of colon: Secondary | ICD-10-CM | POA: Diagnosis not present

## 2020-07-20 DIAGNOSIS — Z0000 Encounter for general adult medical examination without abnormal findings: Secondary | ICD-10-CM | POA: Diagnosis not present

## 2020-07-21 DIAGNOSIS — H6122 Impacted cerumen, left ear: Secondary | ICD-10-CM | POA: Diagnosis not present

## 2020-07-21 DIAGNOSIS — H6121 Impacted cerumen, right ear: Secondary | ICD-10-CM | POA: Diagnosis not present

## 2020-07-21 DIAGNOSIS — H6123 Impacted cerumen, bilateral: Secondary | ICD-10-CM | POA: Diagnosis not present

## 2020-07-21 DIAGNOSIS — E782 Mixed hyperlipidemia: Secondary | ICD-10-CM | POA: Diagnosis not present

## 2020-07-21 DIAGNOSIS — G4700 Insomnia, unspecified: Secondary | ICD-10-CM | POA: Diagnosis not present

## 2020-07-21 DIAGNOSIS — Z0000 Encounter for general adult medical examination without abnormal findings: Secondary | ICD-10-CM | POA: Diagnosis not present

## 2020-07-24 LAB — EXTERNAL GENERIC LAB PROCEDURE: COLOGUARD: NEGATIVE

## 2020-08-05 DIAGNOSIS — Z7185 Encounter for immunization safety counseling: Secondary | ICD-10-CM | POA: Diagnosis not present

## 2020-08-05 DIAGNOSIS — H6121 Impacted cerumen, right ear: Secondary | ICD-10-CM | POA: Diagnosis not present

## 2020-08-05 DIAGNOSIS — Z23 Encounter for immunization: Secondary | ICD-10-CM | POA: Diagnosis not present

## 2020-08-05 DIAGNOSIS — H6122 Impacted cerumen, left ear: Secondary | ICD-10-CM | POA: Diagnosis not present

## 2020-08-14 DIAGNOSIS — F32A Depression, unspecified: Secondary | ICD-10-CM | POA: Diagnosis not present

## 2020-08-14 DIAGNOSIS — R309 Painful micturition, unspecified: Secondary | ICD-10-CM | POA: Diagnosis not present

## 2020-09-17 DIAGNOSIS — L309 Dermatitis, unspecified: Secondary | ICD-10-CM | POA: Diagnosis not present

## 2020-10-27 DIAGNOSIS — R300 Dysuria: Secondary | ICD-10-CM | POA: Diagnosis not present

## 2020-10-29 DIAGNOSIS — S39012A Strain of muscle, fascia and tendon of lower back, initial encounter: Secondary | ICD-10-CM | POA: Diagnosis not present

## 2020-10-29 DIAGNOSIS — M545 Low back pain, unspecified: Secondary | ICD-10-CM | POA: Diagnosis not present

## 2020-12-01 DIAGNOSIS — R1011 Right upper quadrant pain: Secondary | ICD-10-CM | POA: Diagnosis not present

## 2020-12-01 DIAGNOSIS — S39012A Strain of muscle, fascia and tendon of lower back, initial encounter: Secondary | ICD-10-CM | POA: Diagnosis not present

## 2020-12-01 DIAGNOSIS — M545 Low back pain, unspecified: Secondary | ICD-10-CM | POA: Diagnosis not present

## 2020-12-02 ENCOUNTER — Other Ambulatory Visit: Payer: Self-pay | Admitting: Family Medicine

## 2020-12-02 DIAGNOSIS — R1011 Right upper quadrant pain: Secondary | ICD-10-CM

## 2020-12-10 ENCOUNTER — Ambulatory Visit
Admission: RE | Admit: 2020-12-10 | Discharge: 2020-12-10 | Disposition: A | Discharge status: Home / Self Care | Payer: BC Managed Care – PPO | Source: Ambulatory Visit | Attending: Family Medicine | Admitting: Family Medicine

## 2020-12-10 DIAGNOSIS — R1011 Right upper quadrant pain: Secondary | ICD-10-CM

## 2020-12-15 DIAGNOSIS — D3132 Benign neoplasm of left choroid: Secondary | ICD-10-CM | POA: Diagnosis not present

## 2020-12-15 DIAGNOSIS — H3589 Other specified retinal disorders: Secondary | ICD-10-CM | POA: Diagnosis not present

## 2020-12-15 DIAGNOSIS — H43823 Vitreomacular adhesion, bilateral: Secondary | ICD-10-CM | POA: Diagnosis not present

## 2020-12-15 DIAGNOSIS — H35033 Hypertensive retinopathy, bilateral: Secondary | ICD-10-CM | POA: Diagnosis not present

## 2020-12-21 DIAGNOSIS — Z86018 Personal history of other benign neoplasm: Secondary | ICD-10-CM | POA: Diagnosis not present

## 2020-12-21 DIAGNOSIS — S5001XA Contusion of right elbow, initial encounter: Secondary | ICD-10-CM | POA: Diagnosis not present

## 2020-12-21 DIAGNOSIS — L821 Other seborrheic keratosis: Secondary | ICD-10-CM | POA: Diagnosis not present

## 2020-12-21 DIAGNOSIS — D225 Melanocytic nevi of trunk: Secondary | ICD-10-CM | POA: Diagnosis not present

## 2020-12-21 DIAGNOSIS — Z85820 Personal history of malignant melanoma of skin: Secondary | ICD-10-CM | POA: Diagnosis not present

## 2020-12-21 DIAGNOSIS — D485 Neoplasm of uncertain behavior of skin: Secondary | ICD-10-CM | POA: Diagnosis not present

## 2020-12-22 ENCOUNTER — Other Ambulatory Visit: Payer: Self-pay | Admitting: Gastroenterology

## 2020-12-22 ENCOUNTER — Other Ambulatory Visit (HOSPITAL_COMMUNITY): Payer: Self-pay | Admitting: Gastroenterology

## 2020-12-22 DIAGNOSIS — K5904 Chronic idiopathic constipation: Secondary | ICD-10-CM | POA: Diagnosis not present

## 2020-12-22 DIAGNOSIS — R1011 Right upper quadrant pain: Secondary | ICD-10-CM | POA: Diagnosis not present

## 2020-12-22 DIAGNOSIS — K760 Fatty (change of) liver, not elsewhere classified: Secondary | ICD-10-CM | POA: Diagnosis not present

## 2020-12-22 DIAGNOSIS — Z1211 Encounter for screening for malignant neoplasm of colon: Secondary | ICD-10-CM | POA: Diagnosis not present

## 2021-01-11 ENCOUNTER — Other Ambulatory Visit: Payer: Self-pay

## 2021-01-11 ENCOUNTER — Ambulatory Visit (HOSPITAL_COMMUNITY)
Admission: RE | Admit: 2021-01-11 | Discharge: 2021-01-11 | Disposition: A | Discharge status: Home / Self Care | Payer: BC Managed Care – PPO | Source: Ambulatory Visit | Attending: Gastroenterology | Admitting: Gastroenterology

## 2021-01-11 ENCOUNTER — Encounter (HOSPITAL_COMMUNITY): Payer: Self-pay

## 2021-01-11 DIAGNOSIS — R1011 Right upper quadrant pain: Secondary | ICD-10-CM

## 2021-01-29 ENCOUNTER — Ambulatory Visit (HOSPITAL_COMMUNITY)
Admission: RE | Admit: 2021-01-29 | Discharge: 2021-01-29 | Disposition: A | Discharge status: Home / Self Care | Payer: BC Managed Care – PPO | Source: Ambulatory Visit | Attending: Gastroenterology | Admitting: Gastroenterology

## 2021-01-29 DIAGNOSIS — R1011 Right upper quadrant pain: Secondary | ICD-10-CM | POA: Diagnosis not present

## 2021-01-29 MED ORDER — TECHNETIUM TC 99M MEBROFENIN IV KIT
5.5000 | PACK | Freq: Once | INTRAVENOUS | Status: AC | PRN
  Administered 2021-01-29: 5.5 via INTRAVENOUS

## 2021-02-02 DIAGNOSIS — E282 Polycystic ovarian syndrome: Secondary | ICD-10-CM | POA: Diagnosis not present

## 2021-02-02 DIAGNOSIS — F39 Unspecified mood [affective] disorder: Secondary | ICD-10-CM | POA: Diagnosis not present

## 2021-02-02 DIAGNOSIS — E782 Mixed hyperlipidemia: Secondary | ICD-10-CM | POA: Diagnosis not present

## 2021-02-02 DIAGNOSIS — F3341 Major depressive disorder, recurrent, in partial remission: Secondary | ICD-10-CM | POA: Diagnosis not present

## 2021-02-02 DIAGNOSIS — F4010 Social phobia, unspecified: Secondary | ICD-10-CM | POA: Diagnosis not present

## 2021-02-04 DIAGNOSIS — E782 Mixed hyperlipidemia: Secondary | ICD-10-CM | POA: Diagnosis not present

## 2021-02-04 DIAGNOSIS — F331 Major depressive disorder, recurrent, moderate: Secondary | ICD-10-CM | POA: Diagnosis not present

## 2021-02-04 DIAGNOSIS — G4700 Insomnia, unspecified: Secondary | ICD-10-CM | POA: Diagnosis not present

## 2021-02-04 DIAGNOSIS — F411 Generalized anxiety disorder: Secondary | ICD-10-CM | POA: Diagnosis not present

## 2021-02-05 DIAGNOSIS — I10 Essential (primary) hypertension: Secondary | ICD-10-CM | POA: Diagnosis not present

## 2021-02-10 DIAGNOSIS — Z23 Encounter for immunization: Secondary | ICD-10-CM | POA: Diagnosis not present

## 2021-02-23 DIAGNOSIS — H43823 Vitreomacular adhesion, bilateral: Secondary | ICD-10-CM | POA: Diagnosis not present

## 2021-02-23 DIAGNOSIS — H35033 Hypertensive retinopathy, bilateral: Secondary | ICD-10-CM | POA: Diagnosis not present

## 2021-02-23 DIAGNOSIS — D3132 Benign neoplasm of left choroid: Secondary | ICD-10-CM | POA: Diagnosis not present

## 2021-02-23 DIAGNOSIS — H3589 Other specified retinal disorders: Secondary | ICD-10-CM | POA: Diagnosis not present

## 2021-03-03 DIAGNOSIS — Z1211 Encounter for screening for malignant neoplasm of colon: Secondary | ICD-10-CM | POA: Diagnosis not present

## 2021-03-03 DIAGNOSIS — R1011 Right upper quadrant pain: Secondary | ICD-10-CM | POA: Diagnosis not present

## 2021-03-03 DIAGNOSIS — K2950 Unspecified chronic gastritis without bleeding: Secondary | ICD-10-CM | POA: Diagnosis not present

## 2021-03-03 DIAGNOSIS — R1013 Epigastric pain: Secondary | ICD-10-CM | POA: Diagnosis not present

## 2021-03-03 DIAGNOSIS — K449 Diaphragmatic hernia without obstruction or gangrene: Secondary | ICD-10-CM | POA: Diagnosis not present

## 2021-04-06 DIAGNOSIS — E282 Polycystic ovarian syndrome: Secondary | ICD-10-CM | POA: Diagnosis not present

## 2021-04-06 DIAGNOSIS — R443 Hallucinations, unspecified: Secondary | ICD-10-CM | POA: Diagnosis not present

## 2021-04-06 DIAGNOSIS — F3341 Major depressive disorder, recurrent, in partial remission: Secondary | ICD-10-CM | POA: Diagnosis not present

## 2021-04-06 DIAGNOSIS — F4010 Social phobia, unspecified: Secondary | ICD-10-CM | POA: Diagnosis not present

## 2021-04-06 DIAGNOSIS — G4700 Insomnia, unspecified: Secondary | ICD-10-CM | POA: Diagnosis not present

## 2021-04-06 DIAGNOSIS — E782 Mixed hyperlipidemia: Secondary | ICD-10-CM | POA: Diagnosis not present

## 2021-04-06 DIAGNOSIS — F39 Unspecified mood [affective] disorder: Secondary | ICD-10-CM | POA: Diagnosis not present

## 2021-04-06 DIAGNOSIS — I10 Essential (primary) hypertension: Secondary | ICD-10-CM | POA: Diagnosis not present

## 2021-04-08 DIAGNOSIS — Z538 Procedure and treatment not carried out for other reasons: Secondary | ICD-10-CM | POA: Diagnosis not present

## 2021-04-08 DIAGNOSIS — E282 Polycystic ovarian syndrome: Secondary | ICD-10-CM | POA: Diagnosis not present

## 2021-04-08 DIAGNOSIS — Z3202 Encounter for pregnancy test, result negative: Secondary | ICD-10-CM | POA: Diagnosis not present

## 2021-06-01 DIAGNOSIS — I10 Essential (primary) hypertension: Secondary | ICD-10-CM | POA: Diagnosis not present

## 2021-06-16 ENCOUNTER — Other Ambulatory Visit: Payer: Self-pay | Admitting: Family Medicine

## 2021-06-17 ENCOUNTER — Other Ambulatory Visit: Payer: Self-pay | Admitting: Family Medicine

## 2021-06-17 DIAGNOSIS — Z130 Encounter for screening for diseases of the blood and blood-forming organs and certain disorders involving the immune mechanism: Secondary | ICD-10-CM

## 2021-07-01 DIAGNOSIS — J069 Acute upper respiratory infection, unspecified: Secondary | ICD-10-CM | POA: Diagnosis not present

## 2021-07-01 DIAGNOSIS — R059 Cough, unspecified: Secondary | ICD-10-CM | POA: Diagnosis not present

## 2021-07-21 DIAGNOSIS — D225 Melanocytic nevi of trunk: Secondary | ICD-10-CM | POA: Diagnosis not present

## 2021-07-21 DIAGNOSIS — L821 Other seborrheic keratosis: Secondary | ICD-10-CM | POA: Diagnosis not present

## 2021-07-21 DIAGNOSIS — L578 Other skin changes due to chronic exposure to nonionizing radiation: Secondary | ICD-10-CM | POA: Diagnosis not present

## 2021-07-21 DIAGNOSIS — D2221 Melanocytic nevi of right ear and external auricular canal: Secondary | ICD-10-CM | POA: Diagnosis not present

## 2021-07-22 DIAGNOSIS — E782 Mixed hyperlipidemia: Secondary | ICD-10-CM | POA: Diagnosis not present

## 2021-07-22 DIAGNOSIS — Z0000 Encounter for general adult medical examination without abnormal findings: Secondary | ICD-10-CM | POA: Diagnosis not present

## 2021-07-22 DIAGNOSIS — K219 Gastro-esophageal reflux disease without esophagitis: Secondary | ICD-10-CM | POA: Diagnosis not present

## 2021-07-22 DIAGNOSIS — R635 Abnormal weight gain: Secondary | ICD-10-CM | POA: Diagnosis not present

## 2021-07-22 DIAGNOSIS — Z23 Encounter for immunization: Secondary | ICD-10-CM | POA: Diagnosis not present

## 2021-09-07 DIAGNOSIS — G4700 Insomnia, unspecified: Secondary | ICD-10-CM | POA: Diagnosis not present

## 2021-09-07 DIAGNOSIS — L237 Allergic contact dermatitis due to plants, except food: Secondary | ICD-10-CM | POA: Diagnosis not present

## 2021-09-07 DIAGNOSIS — F411 Generalized anxiety disorder: Secondary | ICD-10-CM | POA: Diagnosis not present

## 2021-09-10 DIAGNOSIS — R21 Rash and other nonspecific skin eruption: Secondary | ICD-10-CM | POA: Diagnosis not present

## 2021-09-10 DIAGNOSIS — R5383 Other fatigue: Secondary | ICD-10-CM | POA: Diagnosis not present

## 2021-09-14 DIAGNOSIS — R21 Rash and other nonspecific skin eruption: Secondary | ICD-10-CM | POA: Diagnosis not present

## 2021-10-12 DIAGNOSIS — E559 Vitamin D deficiency, unspecified: Secondary | ICD-10-CM | POA: Diagnosis not present

## 2021-10-12 DIAGNOSIS — I10 Essential (primary) hypertension: Secondary | ICD-10-CM | POA: Diagnosis not present

## 2021-10-12 DIAGNOSIS — E782 Mixed hyperlipidemia: Secondary | ICD-10-CM | POA: Diagnosis not present

## 2021-10-14 DIAGNOSIS — E559 Vitamin D deficiency, unspecified: Secondary | ICD-10-CM | POA: Diagnosis not present

## 2021-10-14 DIAGNOSIS — E782 Mixed hyperlipidemia: Secondary | ICD-10-CM | POA: Diagnosis not present

## 2021-10-14 DIAGNOSIS — I10 Essential (primary) hypertension: Secondary | ICD-10-CM | POA: Diagnosis not present

## 2021-10-14 DIAGNOSIS — G4700 Insomnia, unspecified: Secondary | ICD-10-CM | POA: Diagnosis not present

## 2021-10-20 DIAGNOSIS — D485 Neoplasm of uncertain behavior of skin: Secondary | ICD-10-CM | POA: Diagnosis not present

## 2021-10-20 DIAGNOSIS — R21 Rash and other nonspecific skin eruption: Secondary | ICD-10-CM | POA: Diagnosis not present

## 2021-10-20 DIAGNOSIS — L930 Discoid lupus erythematosus: Secondary | ICD-10-CM | POA: Diagnosis not present

## 2021-10-25 DIAGNOSIS — M329 Systemic lupus erythematosus, unspecified: Secondary | ICD-10-CM | POA: Diagnosis not present

## 2021-10-25 DIAGNOSIS — L930 Discoid lupus erythematosus: Secondary | ICD-10-CM | POA: Diagnosis not present

## 2021-10-26 DIAGNOSIS — F411 Generalized anxiety disorder: Secondary | ICD-10-CM | POA: Diagnosis not present

## 2021-10-26 DIAGNOSIS — M2550 Pain in unspecified joint: Secondary | ICD-10-CM | POA: Diagnosis not present

## 2021-10-26 DIAGNOSIS — I10 Essential (primary) hypertension: Secondary | ICD-10-CM | POA: Diagnosis not present

## 2021-10-26 DIAGNOSIS — L932 Other local lupus erythematosus: Secondary | ICD-10-CM | POA: Diagnosis not present

## 2021-10-26 DIAGNOSIS — R5383 Other fatigue: Secondary | ICD-10-CM | POA: Diagnosis not present

## 2021-10-26 DIAGNOSIS — N3000 Acute cystitis without hematuria: Secondary | ICD-10-CM | POA: Diagnosis not present

## 2021-10-26 DIAGNOSIS — N390 Urinary tract infection, site not specified: Secondary | ICD-10-CM | POA: Diagnosis not present

## 2021-10-28 DIAGNOSIS — L930 Discoid lupus erythematosus: Secondary | ICD-10-CM | POA: Diagnosis not present

## 2021-10-28 DIAGNOSIS — N3000 Acute cystitis without hematuria: Secondary | ICD-10-CM | POA: Diagnosis not present

## 2021-11-02 DIAGNOSIS — N390 Urinary tract infection, site not specified: Secondary | ICD-10-CM | POA: Diagnosis not present

## 2021-11-02 DIAGNOSIS — I10 Essential (primary) hypertension: Secondary | ICD-10-CM | POA: Diagnosis not present

## 2021-11-02 DIAGNOSIS — R5383 Other fatigue: Secondary | ICD-10-CM | POA: Diagnosis not present

## 2021-11-11 DIAGNOSIS — L249 Irritant contact dermatitis, unspecified cause: Secondary | ICD-10-CM | POA: Diagnosis not present

## 2021-12-07 DIAGNOSIS — E559 Vitamin D deficiency, unspecified: Secondary | ICD-10-CM | POA: Diagnosis not present

## 2021-12-07 DIAGNOSIS — I10 Essential (primary) hypertension: Secondary | ICD-10-CM | POA: Diagnosis not present

## 2021-12-07 DIAGNOSIS — E782 Mixed hyperlipidemia: Secondary | ICD-10-CM | POA: Diagnosis not present

## 2021-12-09 DIAGNOSIS — I10 Essential (primary) hypertension: Secondary | ICD-10-CM | POA: Diagnosis not present

## 2021-12-09 DIAGNOSIS — E559 Vitamin D deficiency, unspecified: Secondary | ICD-10-CM | POA: Diagnosis not present

## 2021-12-09 DIAGNOSIS — E782 Mixed hyperlipidemia: Secondary | ICD-10-CM | POA: Diagnosis not present

## 2022-01-09 DIAGNOSIS — R350 Frequency of micturition: Secondary | ICD-10-CM | POA: Diagnosis not present

## 2022-01-09 DIAGNOSIS — R109 Unspecified abdominal pain: Secondary | ICD-10-CM | POA: Diagnosis not present

## 2022-01-09 DIAGNOSIS — R82998 Other abnormal findings in urine: Secondary | ICD-10-CM | POA: Diagnosis not present

## 2022-01-10 DIAGNOSIS — N3000 Acute cystitis without hematuria: Secondary | ICD-10-CM | POA: Diagnosis not present

## 2022-01-10 DIAGNOSIS — R109 Unspecified abdominal pain: Secondary | ICD-10-CM | POA: Diagnosis not present

## 2022-01-12 DIAGNOSIS — N200 Calculus of kidney: Secondary | ICD-10-CM | POA: Diagnosis not present

## 2022-01-12 DIAGNOSIS — N3289 Other specified disorders of bladder: Secondary | ICD-10-CM | POA: Diagnosis not present

## 2022-01-13 DIAGNOSIS — N8110 Cystocele, unspecified: Secondary | ICD-10-CM | POA: Diagnosis not present

## 2022-01-13 DIAGNOSIS — N3946 Mixed incontinence: Secondary | ICD-10-CM | POA: Diagnosis not present

## 2022-01-13 DIAGNOSIS — N200 Calculus of kidney: Secondary | ICD-10-CM | POA: Diagnosis not present

## 2022-01-24 DIAGNOSIS — D2221 Melanocytic nevi of right ear and external auricular canal: Secondary | ICD-10-CM | POA: Diagnosis not present

## 2022-01-24 DIAGNOSIS — L821 Other seborrheic keratosis: Secondary | ICD-10-CM | POA: Diagnosis not present

## 2022-01-24 DIAGNOSIS — D225 Melanocytic nevi of trunk: Secondary | ICD-10-CM | POA: Diagnosis not present

## 2022-01-24 DIAGNOSIS — L814 Other melanin hyperpigmentation: Secondary | ICD-10-CM | POA: Diagnosis not present

## 2022-01-31 DIAGNOSIS — Z87440 Personal history of urinary (tract) infections: Secondary | ICD-10-CM | POA: Diagnosis not present

## 2022-01-31 DIAGNOSIS — N3946 Mixed incontinence: Secondary | ICD-10-CM | POA: Diagnosis not present

## 2022-01-31 DIAGNOSIS — N8110 Cystocele, unspecified: Secondary | ICD-10-CM | POA: Diagnosis not present

## 2022-01-31 DIAGNOSIS — N200 Calculus of kidney: Secondary | ICD-10-CM | POA: Diagnosis not present

## 2022-01-31 DIAGNOSIS — R350 Frequency of micturition: Secondary | ICD-10-CM | POA: Diagnosis not present

## 2022-02-03 DIAGNOSIS — E782 Mixed hyperlipidemia: Secondary | ICD-10-CM | POA: Diagnosis not present

## 2022-02-10 DIAGNOSIS — E782 Mixed hyperlipidemia: Secondary | ICD-10-CM | POA: Diagnosis not present

## 2022-02-10 DIAGNOSIS — G4700 Insomnia, unspecified: Secondary | ICD-10-CM | POA: Diagnosis not present

## 2022-02-10 DIAGNOSIS — Z789 Other specified health status: Secondary | ICD-10-CM | POA: Diagnosis not present

## 2022-02-10 DIAGNOSIS — I10 Essential (primary) hypertension: Secondary | ICD-10-CM | POA: Diagnosis not present

## 2022-02-11 ENCOUNTER — Other Ambulatory Visit: Payer: Self-pay | Admitting: Family Medicine

## 2022-02-11 DIAGNOSIS — Z1231 Encounter for screening mammogram for malignant neoplasm of breast: Secondary | ICD-10-CM

## 2022-02-22 DIAGNOSIS — R350 Frequency of micturition: Secondary | ICD-10-CM | POA: Diagnosis not present

## 2022-02-22 DIAGNOSIS — N952 Postmenopausal atrophic vaginitis: Secondary | ICD-10-CM | POA: Diagnosis not present

## 2022-02-22 DIAGNOSIS — N3281 Overactive bladder: Secondary | ICD-10-CM | POA: Diagnosis not present

## 2022-03-05 IMAGING — NM NM HEPATO W/GB/PHARM/[PERSON_NAME]
2 series · 12 of 12 positions shown · non-contrast
Comparison: Ultrasound December 10, 2020

CLINICAL DATA: Right upper quadrant abdominal pain worse
postprandial

EXAM:
NUCLEAR MEDICINE HEPATOBILIARY IMAGING WITH GALLBLADDER EF
TECHNIQUE: Sequential images of the abdomen were obtained [DATE] minutes
following intravenous administration of radiopharmaceutical. After
oral ingestion of Ensure, gallbladder ejection fraction was
determined. At 60 min, normal ejection fraction is greater than 33%.
RADIOPHARMACEUTICALS:  5.5 mCi Tc-00m  Choletec IV

[Series 1: biliary · 4.14mm/px · 6 of 60 frames shown]
[frame 6/60]
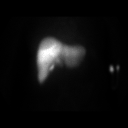
[frame 16/60]
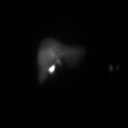
[frame 26/60]
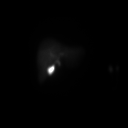
[frame 36/60]
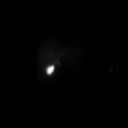
[frame 46/60]
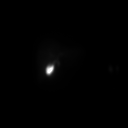
[frame 56/60]
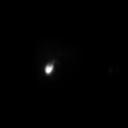

[Series 2: gbef · 4.14mm/px · 6 of 60 frames shown]
[frame 6/60]
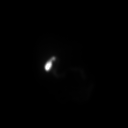
[frame 16/60]
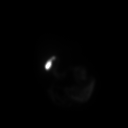
[frame 26/60]
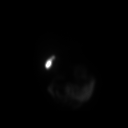
[frame 36/60]
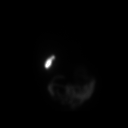
[frame 46/60]
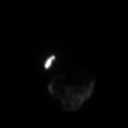
[frame 56/60]
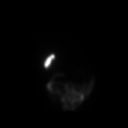

[12 of 12 positions shown; findings below may reference images not displayed]

FINDINGS: There is prompt, uniform radiotracer uptake by the liver with normal
filling of the intrahepatic ducts, common bile duct. Gallbladder
activity is visualized, consistent with patency of cystic duct
(normal < 60 minutes). Additionally there is normal biliary to bowel
transit (normal < 60 minutes), consistent with patent common bile
duct.

Ensure was administered and the gallbladder appears to empty
normally on sequential images. Calculated gallbladder ejection
fraction is 53%. (Normal gallbladder ejection fraction with Ensure
is greater than 33%.)

No evidence of enterogastric biliary reflux.
IMPRESSION: 1.  Patent cystic and common bile ducts.

2.  Normal gallbladder ejection fraction.

## 2022-03-24 DIAGNOSIS — R350 Frequency of micturition: Secondary | ICD-10-CM | POA: Diagnosis not present

## 2022-03-29 DIAGNOSIS — N390 Urinary tract infection, site not specified: Secondary | ICD-10-CM | POA: Diagnosis not present

## 2022-03-30 ENCOUNTER — Ambulatory Visit
Admission: RE | Admit: 2022-03-30 | Discharge: 2022-03-30 | Disposition: A | Discharge status: Home / Self Care | Payer: Managed Care, Other (non HMO) | Source: Ambulatory Visit | Attending: Family Medicine | Admitting: Family Medicine

## 2022-03-30 DIAGNOSIS — Z1231 Encounter for screening mammogram for malignant neoplasm of breast: Secondary | ICD-10-CM

## 2022-04-13 DIAGNOSIS — I10 Essential (primary) hypertension: Secondary | ICD-10-CM | POA: Diagnosis not present

## 2022-04-13 DIAGNOSIS — M791 Myalgia, unspecified site: Secondary | ICD-10-CM | POA: Diagnosis not present

## 2022-04-13 DIAGNOSIS — E782 Mixed hyperlipidemia: Secondary | ICD-10-CM | POA: Diagnosis not present

## 2023-05-15 ENCOUNTER — Other Ambulatory Visit: Payer: Self-pay | Admitting: Family Medicine

## 2023-05-15 DIAGNOSIS — Z1231 Encounter for screening mammogram for malignant neoplasm of breast: Secondary | ICD-10-CM

## 2023-05-16 ENCOUNTER — Ambulatory Visit
Admission: RE | Admit: 2023-05-16 | Discharge: 2023-05-16 | Disposition: A | Discharge status: Home / Self Care | Payer: Managed Care, Other (non HMO) | Source: Ambulatory Visit | Attending: Family Medicine | Admitting: Family Medicine

## 2023-05-16 DIAGNOSIS — Z1231 Encounter for screening mammogram for malignant neoplasm of breast: Secondary | ICD-10-CM
